# Patient Record
Sex: Female | Born: 1980 | Race: White | Hispanic: No | Marital: Married | Smoking: Never smoker
Health system: Southern US, Community
[De-identification: ages and names within clinical notes are randomized; demographics above are authoritative.]

## PROBLEM LIST (undated history)

## (undated) DIAGNOSIS — Z789 Other specified health status: Secondary | ICD-10-CM

## (undated) HISTORY — DX: Other specified health status: Z78.9

---

## 1999-02-22 ENCOUNTER — Other Ambulatory Visit: Admission: RE | Admit: 1999-02-22 | Discharge: 1999-02-22 | Payer: Self-pay | Admitting: Obstetrics and Gynecology

## 2000-02-23 ENCOUNTER — Other Ambulatory Visit: Admission: RE | Admit: 2000-02-23 | Discharge: 2000-02-23 | Payer: Self-pay | Admitting: Obstetrics and Gynecology

## 2009-01-02 ENCOUNTER — Ambulatory Visit: Payer: Self-pay | Admitting: Obstetrics and Gynecology

## 2009-01-02 ENCOUNTER — Inpatient Hospital Stay (HOSPITAL_COMMUNITY): Admission: AD | Admit: 2009-01-02 | Discharge: 2009-01-02 | Payer: Self-pay | Admitting: Obstetrics and Gynecology

## 2009-05-13 ENCOUNTER — Inpatient Hospital Stay (HOSPITAL_COMMUNITY): Admission: RE | Admit: 2009-05-13 | Discharge: 2009-05-15 | Payer: Self-pay | Admitting: Obstetrics & Gynecology

## 2009-05-13 ENCOUNTER — Encounter (INDEPENDENT_AMBULATORY_CARE_PROVIDER_SITE_OTHER): Payer: Self-pay | Admitting: Obstetrics & Gynecology

## 2010-03-19 LAB — URINALYSIS, ROUTINE W REFLEX MICROSCOPIC
Glucose, UA: NEGATIVE mg/dL
Ketones, ur: NEGATIVE mg/dL
Nitrite: NEGATIVE
Protein, ur: NEGATIVE mg/dL
Urobilinogen, UA: 0.2 mg/dL (ref 0.0–1.0)

## 2010-03-19 LAB — WET PREP, GENITAL: Clue Cells Wet Prep HPF POC: NONE SEEN

## 2010-03-19 LAB — URINE CULTURE
Colony Count: NO GROWTH
Culture: NO GROWTH

## 2010-03-19 LAB — CBC
HCT: 30.6 % — ABNORMAL LOW (ref 36.0–46.0)
Platelets: 156 10*3/uL (ref 150–400)
RDW: 12.8 % (ref 11.5–15.5)

## 2010-03-21 LAB — CBC
HCT: 37.2 % (ref 36.0–46.0)
Hemoglobin: 13 g/dL (ref 12.0–15.0)
MCV: 95.3 fL (ref 78.0–100.0)
RBC: 4.18 MIL/uL (ref 3.87–5.11)
WBC: 11.2 10*3/uL — ABNORMAL HIGH (ref 4.0–10.5)
WBC: 19.9 10*3/uL — ABNORMAL HIGH (ref 4.0–10.5)

## 2010-03-21 LAB — RPR: RPR Ser Ql: NONREACTIVE

## 2012-01-02 NOTE — L&D Delivery Note (Signed)
Delivery Note At 1:04 PM a viable and healthy female was delivered via  (Presentation: LOA ).  APGAR: 9, 9; weight pending.  Placenta status: spontaneous, intact .  Cord: 3 vessels with the following complications: none.  Cord pH: na NICU in attendance due to suspected fetal anomaly. MFM notified of delivery.  Anesthesia: epidural Episiotomy: none Lacerations: none Suture Repair: na Est. Blood Loss (mL): 300  Mom to postpartum.  Baby to Couplet care / Skin to Skin.  Jeffren Dombek J 11/13/2012, 1:17 PM

## 2012-04-08 LAB — OB RESULTS CONSOLE ABO/RH: RH Type: POSITIVE

## 2012-04-08 LAB — OB RESULTS CONSOLE RUBELLA ANTIBODY, IGM: Rubella: IMMUNE

## 2012-04-08 LAB — OB RESULTS CONSOLE HEPATITIS B SURFACE ANTIGEN: Hepatitis B Surface Ag: NEGATIVE

## 2012-04-08 LAB — OB RESULTS CONSOLE ANTIBODY SCREEN: Antibody Screen: NEGATIVE

## 2012-04-22 LAB — OB RESULTS CONSOLE GC/CHLAMYDIA
Chlamydia: NEGATIVE
Gonorrhea: NEGATIVE

## 2012-06-24 ENCOUNTER — Other Ambulatory Visit (HOSPITAL_COMMUNITY): Payer: Self-pay | Admitting: Obstetrics & Gynecology

## 2012-06-24 DIAGNOSIS — Z0373 Encounter for suspected fetal anomaly ruled out: Secondary | ICD-10-CM

## 2012-06-25 ENCOUNTER — Ambulatory Visit (HOSPITAL_COMMUNITY): Payer: Self-pay

## 2012-06-25 ENCOUNTER — Ambulatory Visit (HOSPITAL_COMMUNITY)
Admission: RE | Admit: 2012-06-25 | Discharge: 2012-06-25 | Disposition: A | Discharge status: Home / Self Care | Payer: Self-pay | Source: Ambulatory Visit | Attending: Obstetrics & Gynecology | Admitting: Obstetrics & Gynecology

## 2012-06-25 ENCOUNTER — Encounter (HOSPITAL_COMMUNITY): Payer: Self-pay

## 2012-06-25 VITALS — BP 113/68 | HR 85 | Wt 135.0 lb

## 2012-06-25 DIAGNOSIS — Z363 Encounter for antenatal screening for malformations: Secondary | ICD-10-CM | POA: Insufficient documentation

## 2012-06-25 DIAGNOSIS — O358XX0 Maternal care for other (suspected) fetal abnormality and damage, not applicable or unspecified: Secondary | ICD-10-CM | POA: Insufficient documentation

## 2012-06-25 DIAGNOSIS — Z0373 Encounter for suspected fetal anomaly ruled out: Secondary | ICD-10-CM

## 2012-06-25 DIAGNOSIS — Z1389 Encounter for screening for other disorder: Secondary | ICD-10-CM | POA: Insufficient documentation

## 2012-06-25 DIAGNOSIS — O359XX0 Maternal care for (suspected) fetal abnormality and damage, unspecified, not applicable or unspecified: Secondary | ICD-10-CM

## 2012-06-26 ENCOUNTER — Other Ambulatory Visit: Payer: Self-pay

## 2012-07-07 ENCOUNTER — Ambulatory Visit (HOSPITAL_COMMUNITY)
Admission: RE | Admit: 2012-07-07 | Discharge: 2012-07-07 | Disposition: A | Discharge status: Home / Self Care | Payer: Self-pay | Source: Ambulatory Visit | Attending: Obstetrics & Gynecology | Admitting: Obstetrics & Gynecology

## 2012-07-07 ENCOUNTER — Other Ambulatory Visit (HOSPITAL_COMMUNITY): Payer: Self-pay | Admitting: Maternal and Fetal Medicine

## 2012-07-07 DIAGNOSIS — Z3689 Encounter for other specified antenatal screening: Secondary | ICD-10-CM | POA: Insufficient documentation

## 2012-07-07 DIAGNOSIS — O359XX0 Maternal care for (suspected) fetal abnormality and damage, unspecified, not applicable or unspecified: Secondary | ICD-10-CM

## 2012-07-07 DIAGNOSIS — O358XX0 Maternal care for other (suspected) fetal abnormality and damage, not applicable or unspecified: Secondary | ICD-10-CM | POA: Insufficient documentation

## 2012-07-24 ENCOUNTER — Other Ambulatory Visit (HOSPITAL_COMMUNITY): Payer: Self-pay | Admitting: Obstetrics & Gynecology

## 2012-07-24 DIAGNOSIS — O358XX0 Maternal care for other (suspected) fetal abnormality and damage, not applicable or unspecified: Secondary | ICD-10-CM

## 2012-07-29 ENCOUNTER — Ambulatory Visit (HOSPITAL_COMMUNITY)
Admission: RE | Admit: 2012-07-29 | Discharge: 2012-07-29 | Disposition: A | Discharge status: Home / Self Care | Payer: Self-pay | Source: Ambulatory Visit | Attending: Obstetrics & Gynecology | Admitting: Obstetrics & Gynecology

## 2012-07-29 DIAGNOSIS — O358XX0 Maternal care for other (suspected) fetal abnormality and damage, not applicable or unspecified: Secondary | ICD-10-CM | POA: Insufficient documentation

## 2012-07-29 DIAGNOSIS — Z3689 Encounter for other specified antenatal screening: Secondary | ICD-10-CM | POA: Insufficient documentation

## 2012-08-12 ENCOUNTER — Telehealth (HOSPITAL_COMMUNITY): Payer: Self-pay | Admitting: MS"

## 2012-08-12 NOTE — Telephone Encounter (Signed)
Called Ms. YAZLEEMAR STRASSNER regarding prenatal consultation with [facility] pediatric surgery. Appointment is available on 8/20 at 11:00 am. Patient stated that she works two Wednesdays per month, but will plan on this appointment for now. She inquired about the self-pay price for the consult given that she is self-pay. I stated that I did not know the charge for the appointment but would work to find out. Patient has follow-up ultrasound on 8/19 in [place].   Clydie Braun Elide Stalzer 08/12/2012 3:53 PM

## 2012-08-15 ENCOUNTER — Other Ambulatory Visit (HOSPITAL_COMMUNITY): Payer: Self-pay | Admitting: Obstetrics & Gynecology

## 2012-08-15 DIAGNOSIS — O358XX1 Maternal care for other (suspected) fetal abnormality and damage, fetus 1: Secondary | ICD-10-CM

## 2012-08-19 ENCOUNTER — Ambulatory Visit (HOSPITAL_COMMUNITY)
Admission: RE | Admit: 2012-08-19 | Discharge: 2012-08-19 | Disposition: A | Discharge status: Home / Self Care | Payer: Self-pay | Source: Ambulatory Visit | Attending: Obstetrics & Gynecology | Admitting: Obstetrics & Gynecology

## 2012-08-19 VITALS — BP 117/68 | HR 86 | Wt 148.0 lb

## 2012-08-19 DIAGNOSIS — O358XX1 Maternal care for other (suspected) fetal abnormality and damage, fetus 1: Secondary | ICD-10-CM

## 2012-08-19 DIAGNOSIS — O358XX0 Maternal care for other (suspected) fetal abnormality and damage, not applicable or unspecified: Secondary | ICD-10-CM | POA: Insufficient documentation

## 2012-08-19 NOTE — Progress Notes (Signed)
Emma Butler  was seen today for an ultrasound appointment.  See full report in [facility].  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  No evidence of hydrops Normal amniotic fluid volume  Appropriate interval growth with EFW at the 57th %tile    Recommendations: Recommend follow-up ultrasound examination in 3 weeks for interval growth and reevaluation  Alpha Gula, MD

## 2012-09-09 ENCOUNTER — Encounter (HOSPITAL_COMMUNITY): Payer: Self-pay

## 2012-09-09 ENCOUNTER — Ambulatory Visit (HOSPITAL_COMMUNITY)
Admission: RE | Admit: 2012-09-09 | Discharge: 2012-09-09 | Disposition: A | Discharge status: Home / Self Care | Payer: Self-pay | Source: Ambulatory Visit | Attending: Obstetrics & Gynecology | Admitting: Obstetrics & Gynecology

## 2012-09-09 DIAGNOSIS — O358XX0 Maternal care for other (suspected) fetal abnormality and damage, not applicable or unspecified: Secondary | ICD-10-CM | POA: Insufficient documentation

## 2012-10-07 ENCOUNTER — Other Ambulatory Visit (HOSPITAL_COMMUNITY): Payer: Self-pay | Admitting: Obstetrics & Gynecology

## 2012-10-07 DIAGNOSIS — O358XX0 Maternal care for other (suspected) fetal abnormality and damage, not applicable or unspecified: Secondary | ICD-10-CM

## 2012-10-08 ENCOUNTER — Ambulatory Visit (HOSPITAL_COMMUNITY)
Admission: RE | Admit: 2012-10-08 | Discharge: 2012-10-08 | Disposition: A | Discharge status: Home / Self Care | Payer: Self-pay | Source: Ambulatory Visit | Attending: Obstetrics & Gynecology | Admitting: Obstetrics & Gynecology

## 2012-10-08 DIAGNOSIS — O358XX0 Maternal care for other (suspected) fetal abnormality and damage, not applicable or unspecified: Secondary | ICD-10-CM | POA: Insufficient documentation

## 2012-10-08 NOTE — Progress Notes (Signed)
Emma Butler  was seen today for an ultrasound appointment.  See full report in [facility].  Impression: IUP at 34 1/7  weeks CPAM identifed on previous studies difficult to delineate.   The left lower lung appears to be somewhat echogenic, but difficult to identify a distinct mass.  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 No mediastinal shift noted. Interval fetal growth is appropriate (54th %tile) Normal amniotic fluid volume   Recommendations: Feel that the patient may safely deliver at [facility]. Recommend follow up with Peds surgery regardless of the findings after delivery. Follow-up ultrasounds as clinically indicated.   Alpha Gula, MD

## 2012-10-29 ENCOUNTER — Encounter (HOSPITAL_COMMUNITY): Payer: Self-pay | Admitting: *Deleted

## 2012-10-29 ENCOUNTER — Telehealth (HOSPITAL_COMMUNITY): Payer: Self-pay | Admitting: *Deleted

## 2012-10-29 NOTE — Telephone Encounter (Signed)
Preadmission screen  

## 2012-10-31 ENCOUNTER — Other Ambulatory Visit: Payer: Self-pay | Admitting: Obstetrics & Gynecology

## 2012-11-05 LAB — OB RESULTS CONSOLE GBS: GBS: POSITIVE

## 2012-11-12 ENCOUNTER — Inpatient Hospital Stay (HOSPITAL_COMMUNITY)
Admission: RE | Admit: 2012-11-12 | Discharge: 2012-11-15 | DRG: 775 | Disposition: A | Discharge status: Home / Self Care | Payer: Self-pay | Source: Ambulatory Visit | Attending: Obstetrics and Gynecology | Admitting: Obstetrics and Gynecology

## 2012-11-12 ENCOUNTER — Other Ambulatory Visit: Payer: Self-pay | Admitting: Obstetrics and Gynecology

## 2012-11-12 ENCOUNTER — Encounter (HOSPITAL_COMMUNITY): Payer: Self-pay

## 2012-11-12 DIAGNOSIS — Z885 Allergy status to narcotic agent status: Secondary | ICD-10-CM

## 2012-11-12 DIAGNOSIS — O358XX0 Maternal care for other (suspected) fetal abnormality and damage, not applicable or unspecified: Principal | ICD-10-CM | POA: Diagnosis present

## 2012-11-12 LAB — CBC
HCT: 36.6 % (ref 36.0–46.0)
Hemoglobin: 12.9 g/dL (ref 12.0–15.0)
MCHC: 35.2 g/dL (ref 30.0–36.0)
WBC: 12.6 10*3/uL — ABNORMAL HIGH (ref 4.0–10.5)

## 2012-11-12 MED ORDER — LIDOCAINE HCL (PF) 1 % IJ SOLN
30.0000 mL | INTRAMUSCULAR | Status: DC | PRN
  Filled 2012-11-12: qty 30

## 2012-11-12 MED ORDER — ONDANSETRON HCL 4 MG/2ML IJ SOLN: 4.0000 mg | Freq: Four times a day (QID) | INTRAMUSCULAR | Status: DC | PRN

## 2012-11-12 MED ORDER — DIPHENHYDRAMINE HCL 50 MG/ML IJ SOLN: 12.5000 mg | INTRAMUSCULAR | Status: DC | PRN

## 2012-11-12 MED ORDER — FENTANYL 2.5 MCG/ML BUPIVACAINE 1/10 % EPIDURAL INFUSION (WH - ANES)
14.0000 mL/h | INTRAMUSCULAR | Status: DC | PRN
  Filled 2012-11-12: qty 125

## 2012-11-12 MED ORDER — MISOPROSTOL 25 MCG QUARTER TABLET
25.0000 ug | ORAL_TABLET | ORAL | Status: DC | PRN
  Administered 2012-11-12 – 2012-11-13 (×3): 25 ug via VAGINAL
  Filled 2012-11-12: qty 0.25
  Filled 2012-11-12: qty 1
  Filled 2012-11-12 (×2): qty 0.25

## 2012-11-12 MED ORDER — PENICILLIN G POTASSIUM 5000000 UNITS IJ SOLR: 5.0000 10*6.[IU] | Freq: Once | INTRAVENOUS | Status: DC

## 2012-11-12 MED ORDER — TERBUTALINE SULFATE 1 MG/ML IJ SOLN: 0.2500 mg | Freq: Once | INTRAMUSCULAR | Status: AC | PRN

## 2012-11-12 MED ORDER — LACTATED RINGERS IV SOLN: 500.0000 mL | INTRAVENOUS | Status: DC | PRN

## 2012-11-12 MED ORDER — OXYTOCIN 40 UNITS IN LACTATED RINGERS INFUSION - SIMPLE MED: 62.5000 mL/h | INTRAVENOUS | Status: DC

## 2012-11-12 MED ORDER — LACTATED RINGERS IV SOLN
INTRAVENOUS | Status: DC
  Administered 2012-11-13 (×3): via INTRAVENOUS

## 2012-11-12 MED ORDER — PENICILLIN G POTASSIUM 5000000 UNITS IJ SOLR: 2.5000 10*6.[IU] | INTRAMUSCULAR | Status: DC

## 2012-11-12 MED ORDER — PHENYLEPHRINE 40 MCG/ML (10ML) SYRINGE FOR IV PUSH (FOR BLOOD PRESSURE SUPPORT)
80.0000 ug | PREFILLED_SYRINGE | INTRAVENOUS | Status: DC | PRN
  Filled 2012-11-12: qty 2

## 2012-11-12 MED ORDER — PHENYLEPHRINE 40 MCG/ML (10ML) SYRINGE FOR IV PUSH (FOR BLOOD PRESSURE SUPPORT)
80.0000 ug | PREFILLED_SYRINGE | INTRAVENOUS | Status: DC | PRN
Start: 2012-11-12 — End: 2012-11-13
  Filled 2012-11-12: qty 2
  Filled 2012-11-12: qty 10

## 2012-11-12 MED ORDER — ZOLPIDEM TARTRATE 5 MG PO TABS
5.0000 mg | ORAL_TABLET | Freq: Every evening | ORAL | Status: DC | PRN
  Administered 2012-11-12: 5 mg via ORAL
  Filled 2012-11-12: qty 1

## 2012-11-12 MED ORDER — ACETAMINOPHEN 325 MG PO TABS: 650.0000 mg | ORAL_TABLET | ORAL | Status: DC | PRN

## 2012-11-12 MED ORDER — OXYTOCIN 40 UNITS IN LACTATED RINGERS INFUSION - SIMPLE MED
1.0000 m[IU]/min | INTRAVENOUS | Status: DC
  Administered 2012-11-13 (×2): 2 m[IU]/min via INTRAVENOUS
  Filled 2012-11-12: qty 1000

## 2012-11-12 MED ORDER — OXYTOCIN BOLUS FROM INFUSION: 500.0000 mL | INTRAVENOUS | Status: DC

## 2012-11-12 MED ORDER — EPHEDRINE 5 MG/ML INJ
10.0000 mg | INTRAVENOUS | Status: DC | PRN
  Filled 2012-11-12: qty 4
  Filled 2012-11-12: qty 2

## 2012-11-12 MED ORDER — LACTATED RINGERS IV SOLN: 500.0000 mL | Freq: Once | INTRAVENOUS | Status: DC

## 2012-11-12 MED ORDER — CITRIC ACID-SODIUM CITRATE 334-500 MG/5ML PO SOLN: 30.0000 mL | ORAL | Status: DC | PRN

## 2012-11-12 MED ORDER — OXYCODONE-ACETAMINOPHEN 5-325 MG PO TABS: 1.0000 | ORAL_TABLET | ORAL | Status: DC | PRN

## 2012-11-12 MED ORDER — IBUPROFEN 600 MG PO TABS: 600.0000 mg | ORAL_TABLET | Freq: Four times a day (QID) | ORAL | Status: DC | PRN

## 2012-11-12 MED ORDER — EPHEDRINE 5 MG/ML INJ
10.0000 mg | INTRAVENOUS | Status: DC | PRN
  Filled 2012-11-12: qty 2

## 2012-11-12 NOTE — Progress Notes (Signed)
Emma Butler is a 32 y.o. G2P1001 at [redacted]w[redacted]d by LMP admitted for induction of labor due to suspected fetal anomaly.  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  Catherin Doorn J 11/12/2012, 8:46 PM

## 2012-11-13 ENCOUNTER — Encounter (HOSPITAL_COMMUNITY): Payer: Self-pay | Admitting: Anesthesiology

## 2012-11-13 ENCOUNTER — Inpatient Hospital Stay (HOSPITAL_COMMUNITY): Admission: RE | Admit: 2012-11-13 | Payer: Self-pay | Source: Ambulatory Visit

## 2012-11-13 ENCOUNTER — Encounter (HOSPITAL_COMMUNITY): Payer: Self-pay

## 2012-11-13 ENCOUNTER — Inpatient Hospital Stay (HOSPITAL_COMMUNITY): Payer: Self-pay | Admitting: Anesthesiology

## 2012-11-13 ENCOUNTER — Inpatient Hospital Stay (HOSPITAL_COMMUNITY): Admission: AD | Admit: 2012-11-13 | Payer: Self-pay | Source: Ambulatory Visit | Admitting: Obstetrics & Gynecology

## 2012-11-13 LAB — RPR: RPR Ser Ql: NONREACTIVE

## 2012-11-13 LAB — ABO/RH: ABO/RH(D): O POS

## 2012-11-13 MED ORDER — FENTANYL 2.5 MCG/ML BUPIVACAINE 1/10 % EPIDURAL INFUSION (WH - ANES)
INTRAMUSCULAR | Status: DC | PRN
  Administered 2012-11-13: 14 mL/h via EPIDURAL

## 2012-11-13 MED ORDER — METHYLERGONOVINE MALEATE 0.2 MG/ML IJ SOLN: 0.2000 mg | INTRAMUSCULAR | Status: DC | PRN

## 2012-11-13 MED ORDER — EPHEDRINE 5 MG/ML INJ: 10.0000 mg | INTRAVENOUS | Status: DC | PRN

## 2012-11-13 MED ORDER — SENNOSIDES-DOCUSATE SODIUM 8.6-50 MG PO TABS
2.0000 | ORAL_TABLET | ORAL | Status: DC
  Administered 2012-11-14: 2 via ORAL
  Filled 2012-11-13 (×2): qty 2

## 2012-11-13 MED ORDER — SIMETHICONE 80 MG PO CHEW: 80.0000 mg | CHEWABLE_TABLET | ORAL | Status: DC | PRN

## 2012-11-13 MED ORDER — PRENATAL MULTIVITAMIN CH
1.0000 | ORAL_TABLET | Freq: Every day | ORAL | Status: DC
  Administered 2012-11-14 – 2012-11-15 (×2): 1 via ORAL
  Filled 2012-11-13 (×2): qty 1

## 2012-11-13 MED ORDER — ONDANSETRON HCL 4 MG/2ML IJ SOLN: 4.0000 mg | INTRAMUSCULAR | Status: DC | PRN

## 2012-11-13 MED ORDER — WITCH HAZEL-GLYCERIN EX PADS: 1.0000 "application " | MEDICATED_PAD | CUTANEOUS | Status: DC | PRN

## 2012-11-13 MED ORDER — METHYLERGONOVINE MALEATE 0.2 MG PO TABS: 0.2000 mg | ORAL_TABLET | ORAL | Status: DC | PRN

## 2012-11-13 MED ORDER — ONDANSETRON HCL 4 MG PO TABS: 4.0000 mg | ORAL_TABLET | ORAL | Status: DC | PRN

## 2012-11-13 MED ORDER — DIPHENHYDRAMINE HCL 25 MG PO CAPS: 25.0000 mg | ORAL_CAPSULE | Freq: Four times a day (QID) | ORAL | Status: DC | PRN

## 2012-11-13 MED ORDER — PHENYLEPHRINE 40 MCG/ML (10ML) SYRINGE FOR IV PUSH (FOR BLOOD PRESSURE SUPPORT): 80.0000 ug | PREFILLED_SYRINGE | INTRAVENOUS | Status: DC | PRN

## 2012-11-13 MED ORDER — TETANUS-DIPHTH-ACELL PERTUSSIS 5-2.5-18.5 LF-MCG/0.5 IM SUSP: 0.5000 mL | Freq: Once | INTRAMUSCULAR | Status: DC

## 2012-11-13 MED ORDER — FENTANYL 2.5 MCG/ML BUPIVACAINE 1/10 % EPIDURAL INFUSION (WH - ANES): 14.0000 mL/h | INTRAMUSCULAR | Status: DC | PRN

## 2012-11-13 MED ORDER — IBUPROFEN 600 MG PO TABS
600.0000 mg | ORAL_TABLET | Freq: Four times a day (QID) | ORAL | Status: DC
  Administered 2012-11-13 – 2012-11-15 (×7): 600 mg via ORAL
  Filled 2012-11-13 (×7): qty 1

## 2012-11-13 MED ORDER — OXYCODONE-ACETAMINOPHEN 5-325 MG PO TABS: 1.0000 | ORAL_TABLET | ORAL | Status: DC | PRN

## 2012-11-13 MED ORDER — DEXTROSE 5 % IV SOLN
5.0000 10*6.[IU] | Freq: Once | INTRAVENOUS | Status: AC
  Administered 2012-11-13: 5 10*6.[IU] via INTRAVENOUS
  Filled 2012-11-13: qty 5

## 2012-11-13 MED ORDER — PENICILLIN G POTASSIUM 5000000 UNITS IJ SOLR
2.5000 10*6.[IU] | INTRAVENOUS | Status: DC
  Administered 2012-11-13 (×2): 2.5 10*6.[IU] via INTRAVENOUS
  Filled 2012-11-13 (×6): qty 2.5

## 2012-11-13 MED ORDER — BENZOCAINE-MENTHOL 20-0.5 % EX AERO: 1.0000 "application " | INHALATION_SPRAY | CUTANEOUS | Status: DC | PRN

## 2012-11-13 MED ORDER — DIPHENHYDRAMINE HCL 50 MG/ML IJ SOLN: 12.5000 mg | INTRAMUSCULAR | Status: DC | PRN

## 2012-11-13 MED ORDER — LACTATED RINGERS IV SOLN
500.0000 mL | Freq: Once | INTRAVENOUS | Status: AC
  Administered 2012-11-13: 500 mL via INTRAVENOUS

## 2012-11-13 MED ORDER — LIDOCAINE HCL (PF) 1 % IJ SOLN
INTRAMUSCULAR | Status: DC | PRN
  Administered 2012-11-13 (×2): 4 mL

## 2012-11-13 MED ORDER — LANOLIN HYDROUS EX OINT: TOPICAL_OINTMENT | CUTANEOUS | Status: DC | PRN

## 2012-11-13 MED ORDER — ZOLPIDEM TARTRATE 5 MG PO TABS: 5.0000 mg | ORAL_TABLET | Freq: Every evening | ORAL | Status: DC | PRN

## 2012-11-13 MED ORDER — DIBUCAINE 1 % RE OINT: 1.0000 "application " | TOPICAL_OINTMENT | RECTAL | Status: DC | PRN

## 2012-11-13 NOTE — Lactation Note (Signed)
This note was copied from the chart of Emma Ronita Hargreaves. Lactation Consultation Note  Initial visit at 8 hours of age.  [facility] LC resources given and discussed. Baby already latched upon my arrival but slips off.  Mom is holding in cradle hold and assisted to cross cradle, mom resistant to position change.  Baby rolls up bottom lip, chin tug taught to mom .  Baby is able to get deep latch with few swallows, but licks and does not always latch back well when she comes off.  Assisted with football on right breast with few sucks.  Baby has nursed about 15 of the 30 minutes during attempt.  Baby has wet diaper.  Mom is expecting baby's bath soon. Discussed deep latch, positioning, pillow support, hand expression and cue feedings.  Mom to call for assist as needed.  Patient Name: Emma Butler [id number] Date: 11/13/2012 Reason for consult: Initial assessment   Maternal Data Formula Feeding for Exclusion: No Infant to breast within first hour of birth: Yes Has patient been taught Hand Expression?: Yes Does the patient have breastfeeding experience prior to this delivery?: Yes  Feeding Feeding Type: Breast Fed Length of feed: 15 min  LATCH Score/Interventions Latch: Repeated attempts needed to sustain latch, nipple held in mouth throughout feeding, stimulation needed to elicit sucking reflex. Intervention(s): Breast compression;Breast massage;Assist with latch;Adjust position  Audible Swallowing: A few with stimulation  Type of Nipple: Everted at rest and after stimulation  Comfort (Breast/Nipple): Soft / non-tender     Hold (Positioning): Assistance needed to correctly position infant at breast and maintain latch. Intervention(s): Skin to skin;Position options;Support Pillows;Breastfeeding basics reviewed  LATCH Score: 7  Lactation Tools Discussed/Used     Consult Status Consult Status: PRN    Jannifer Rodney 11/13/2012, 9:21 PM

## 2012-11-13 NOTE — Anesthesia Preprocedure Evaluation (Signed)

## 2012-11-13 NOTE — Anesthesia Procedure Notes (Signed)
Epidural Patient location during procedure: OB Start time: 11/13/2012 9:54 AM  Staffing Anesthesiologist: Mical Kicklighter A. Performed by: anesthesiologist   Preanesthetic Checklist Completed: patient identified, site marked, surgical consent, pre-op evaluation, timeout performed, IV checked, risks and benefits discussed and monitors and equipment checked  Epidural Patient position: sitting Prep: site prepped and draped and DuraPrep Patient monitoring: continuous pulse ox and blood pressure Approach: midline Injection technique: LOR air  Needle:  Needle type: Tuohy  Needle gauge: 17 G Needle length: 9 cm and 9 Needle insertion depth: 5 cm cm Catheter type: closed end flexible Catheter size: 19 Gauge Catheter at skin depth: 10 cm Test dose: negative and Other  Assessment Events: blood not aspirated, injection not painful, no injection resistance, negative IV test and no paresthesia  Additional Notes Patient identified. Risks and benefits discussed including failed block, incomplete  Pain control, post dural puncture headache, nerve damage, paralysis, blood pressure Changes, nausea, vomiting, reactions to medications-both toxic and allergic and post Partum back pain. All questions were answered. Patient expressed understanding and wished to proceed. Sterile technique was used throughout procedure. Epidural site was Dressed with sterile barrier dressing. No paresthesias, signs of intravascular injection Or signs of intrathecal spread were encountered.  Patient was more comfortable after the epidural was dosed. Please see RN's note for documentation of vital signs and FHR which are stable.

## 2012-11-13 NOTE — Progress Notes (Signed)
Emma Butler is a 32 y.o. G2P1001 at [redacted]w[redacted]d by LMP admitted for induction of labor due to suspected fetal anomaly.  Subjective: comfortable  Objective: BP 101/69  Pulse 74  Temp(Src) 98.5 F (36.9 C) (Oral)  Resp 18  Ht 5\' 3"  (1.6 m)  Wt 71.215 kg (157 lb)  BMI 27.82 kg/m2  SpO2 98%  LMP 02/12/2012      FHT:  FHR: 125 bpm, variability: moderate,  accelerations:  Present,  decelerations:  Absent UC:   regular, every 2 minutes SVE:   Dilation: 6 Effacement (%): 90 Station: 0 Exam by:: Bonnie Roig  Labs: Lab Results  Component Value Date   WBC 12.6* 11/12/2012   HGB 12.9 11/12/2012   HCT 36.6 11/12/2012   MCV 88.2 11/12/2012   PLT 185 11/12/2012    Assessment / Plan: Induction of labor due to fetal anomaly,  progressing well on pitocin  Labor: Progressing normally Preeclampsia:  no signs or symptoms of toxicity Fetal Wellbeing:  Category I Pain Control:  Epidural I/D:  n/a Anticipated MOD:  NSVD  Kairon Shock J 11/13/2012, 11:50 AM

## 2012-11-13 NOTE — Progress Notes (Signed)
Emma Butler is a 32 y.o. G2P1001 at [redacted]w[redacted]d by LMP admitted for induction of labor due to suspected fetal anomaly.  Subjective: comfortable  Objective: BP 104/75  Pulse 81  Temp(Src) 98.5 F (36.9 C) (Oral)  Resp 20  Ht 5\' 3"  (1.6 m)  Wt 71.215 kg (157 lb)  BMI 27.82 kg/m2  LMP 02/12/2012      FHT:  FHR: 155 bpm, variability: moderate,  accelerations:  Present,  decelerations:  Absent UC:   irregular, every 4 minutes SVE:   Dilation: 3 Effacement (%): 70 Station: -2 Exam by:: dr. Billy Coast AROM- clear  Labs: Lab Results  Component Value Date   WBC 12.6* 11/12/2012   HGB 12.9 11/12/2012   HCT 36.6 11/12/2012   MCV 88.2 11/12/2012   PLT 185 11/12/2012    Assessment / Plan: Induction of labor due to suspected fetal anomaly,  progressing well on pitocin  Labor: Progressing normally Preeclampsia:  no signs or symptoms of toxicity Fetal Wellbeing:  Category I Pain Control:  Labor support without medications I/D:  n/a Anticipated MOD:  NSVD NICU aware  Emma Butler J 11/13/2012, 8:52 AM

## 2012-11-14 LAB — CBC
Hemoglobin: 12.6 g/dL (ref 12.0–15.0)
Platelets: 166 10*3/uL (ref 150–400)
RBC: 4.13 MIL/uL (ref 3.87–5.11)
WBC: 13.9 10*3/uL — ABNORMAL HIGH (ref 4.0–10.5)

## 2012-11-14 NOTE — Anesthesia Postprocedure Evaluation (Signed)
  Anesthesia Post-op Note  Patient: Emma Butler  Procedure(s) Performed: * No procedures listed *  Patient Location: PACU  Anesthesia Type:Epidural  Level of Consciousness: awake, alert  and oriented  Airway and Oxygen Therapy: Patient Spontanous Breathing  Post-op Pain: none   Post-op Assessment: Post-op Vital signs reviewed, Patient's Cardiovascular Status Stable, Respiratory Function Stable, Patent Airway, No signs of Nausea or vomiting, Pain level controlled, No headache, No backache, No residual numbness and No residual motor weakness  Post-op Vital Signs: Reviewed and stable  Complications: No apparent anesthesia complications

## 2012-11-14 NOTE — H&P (Signed)
Emma Butler, Emma Butler               ACCOUNT NO.:  [id number]  MEDICAL RECORD NO.:  [id number]  LOCATION:  9173                          FACILITY:  [facility]  PHYSICIAN:  Lenoard Aden, M.D.DATE OF BIRTH:  [birth year]  DATE OF ADMISSION:  11/12/2012 DATE OF DISCHARGE:                             HISTORY & PHYSICAL   CHIEF COMPLAINT:  Induction for suspected fetal anomaly.  HISTORY OF PRESENT ILLNESS:  She is a 32 year old white female, G2, P1, at 39 weeks and 1/7th days who presents for induction of labor due to suspected fetal anomaly.  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  Recommendations were that she be induced at 39 weeks, and she have a Pediatric Surgery consultation.  Chest CT of the baby post delivery.  ALLERGIES:  The patient has allergies to CODEINE, adhesive tape.  SOCIAL HISTORY:  She is a nonsmoker, nondrinker.  She denies domestic or physical violence.  She has a previous history of vaginal delivery.  MEDICATIONS:  Prenatal vitamins.  FAMILY HISTORY:  Lung cancer, depression, ovarian cancer, migraine headache, chronic hypertension, heart disease, history of a 7 pound 13 ounce child born in 2011.  No previous surgical history.  PHYSICAL EXAMINATION:  GENERAL:  She is a well-developed, well- nourished, white female, in no acute distress. HEENT:  Normal. NECK:  Supple.  Full range of motion. LUNGS:  Clear. HEART:  Regular rate and rhythm. ABDOMEN:  Soft, gravid, nontender.  Estimated fetal weight 6.5 to 7 pounds.  Cervix per RN, 2 cm, 50% vertex, -2.  Cytotec placed. EXTREMITIES:  There are no cords. NEUROLOGIC:  Nonfocal. SKIN:  Intact.  NST is reactive.  IMPRESSION: 1. Thirty-nine weeks. 2. Suspected fetal anomaly with questionable resolution of congenital     pulmonary adenoid malformation.  PLAN:  To proceed with Cytotec, Pitocin in a.m., epidural as  needed, NICU at delivery, NICU to be notified recommendations for neonatal chest CT and Pediatric Surgical consult post delivery.     Lenoard Aden, M.D.     RJT/MEDQ  D:  11/12/2012  T:  11/13/2012  Job:  [id number]

## 2012-11-14 NOTE — Progress Notes (Signed)
PPD 1 SVD  S:  Reports feeling well             Tolerating po/ No nausea or vomiting             Bleeding is light             Pain controlled with motrin and percocet             Up ad lib / ambulatory / voiding QS  Newborn breast feeding    O:               VS: BP 96/57  Pulse 56  Temp(Src) 98.4 F (36.9 C) (Oral)  Resp 18  Ht 5\' 3"  (1.6 m)  Wt 71.215 kg (157 lb)  BMI 27.82 kg/m2  SpO2 98%  LMP 02/12/2012   LABS:              Recent Labs  11/12/12 2015 11/14/12 0530  WBC 12.6* 13.9*  HGB 12.9 12.6  PLT 185 166               Blood type: --/--/O POS (11/12 2015)  Rubella: Immune (04/08 0000)                     I&O: Intake/Output     11/13 0701 - 11/14 0700 11/14 0701 - 11/15 0700   Blood 300    Total Output 300     Net -300                        Physical Exam:             Alert and oriented X3  Lungs: Clear and unlabored  Heart: regular rate and rhythm / no mumurs  Abdomen: soft, non-tender, non-distended              Fundus: firm, non-tender, U-1  Perineum: no edema  Lochia: light  Extremities: no edema, no calf pain or tenderness    A: PPD # 1   Doing well - stable status  P: Routine post partum orders  DC in am  Marlinda Mike CNM, MSN, [facility] 11/14/2012, 12:59 PM

## 2012-11-15 MED ORDER — IBUPROFEN 600 MG PO TABS: 600.0000 mg | ORAL_TABLET | Freq: Four times a day (QID) | ORAL | Status: DC

## 2012-11-15 MED ORDER — FLUCONAZOLE 150 MG PO TABS: 150.0000 mg | ORAL_TABLET | Freq: Every day | ORAL | Status: DC

## 2012-11-15 NOTE — Discharge Summary (Signed)
Obstetric Discharge Summary  Reason for Admission: induction of labor Prenatal Procedures: ultrasound Intrapartum Procedures: spontaneous vaginal delivery and GBS prophylaxis Postpartum Procedures: none Complications-Operative and Postpartum: none Hemoglobin  Date Value Range Status  11/14/2012 12.6  12.0 - 15.0 g/dL Final     HCT  Date Value Range Status  11/14/2012 36.7  36.0 - 46.0 % Final    Physical Exam:  General: alert, cooperative and no distress Lochia: appropriate Uterine Fundus: firm DVT Evaluation: No evidence of DVT seen on physical exam.  Discharge Diagnoses: Term Pregnancy-delivered  Discharge Information: Date: 11/15/2012 Activity: pelvic rest Diet: routine Medications: PNV and Ibuprofen Condition: stable Instructions: refer to practice specific booklet Discharge to: home Follow-up Information   Follow up with Emma Aden, MD. Schedule an appointment as soon as possible for a visit in 6 weeks.   Specialty:  Obstetrics and Gynecology   Contact information:   [address] [place] [address] [phone]       Newborn Data: Live born female  Birth Weight: 7 lb 2.5 oz (3246 g) APGAR: 9, 9  Home with mother.  Emma Butler 11/15/2012, 12:19 PM

## 2012-11-15 NOTE — Progress Notes (Signed)
PPD 2 SVD  S:  Reports feeling great, ready to go home             Tolerating po/ No nausea or vomiting             Bleeding is light             Pain controlled withMotrin             Up ad lib / ambulatory / voiding QS  Newborn breast feeding    O:               VS: BP 105/56  Pulse 53  Temp(Src) 98.1 F (36.7 C) (Oral)  Resp 18  Ht 5\' 3"  (1.6 m)  Wt 71.215 kg (157 lb)  BMI 27.82 kg/m2  SpO2 100%  LMP 02/12/2012   LABS:              Recent Labs  11/12/12 2015 11/14/12 0530  WBC 12.6* 13.9*  HGB 12.9 12.6  PLT 185 166               Blood type: --/--/O POS (11/12 2015)  Rubella: Immune (04/08 0000)                                  Physical Exam:             Alert and oriented X3  Lungs: Clear and unlabored  Heart: regular rate and rhythm / no mumurs  Abdomen: soft, non-tender, non-distended              Fundus: firm, non-tender, U-2  Perineum: intact  Lochia: scant to light rubra, no clots  Extremities: +1 pedal edema, no calf pain or tenderness    A: PPD # 2   Doing well - stable status  P: Routine post partum orders  D/C home today  Cyndee Brightly [facility] 11/15/2012, 12:07 PM

## 2013-01-10 NOTE — Progress Notes (Signed)
Seen and agree at time of discharge from hospital  Emma Butler Fix CNM MSN [id number]

## 2013-11-02 ENCOUNTER — Encounter (HOSPITAL_COMMUNITY): Payer: Self-pay

## 2014-03-12 IMAGING — US US OB FOLLOW-UP
1 series · 12 of 28 positions shown · non-contrast
Comparison: none

[Series 1: us ob follow-up · 0.23mm/px · 12 of 43 slices shown]
[im 2/43]
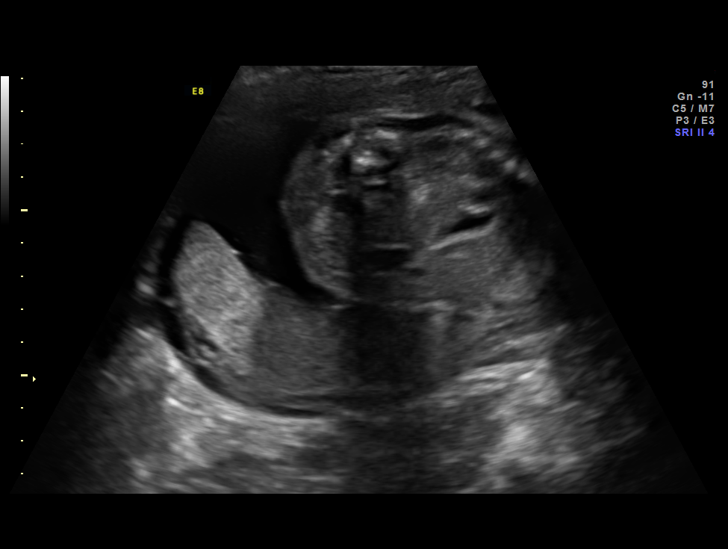
[im 5/43]
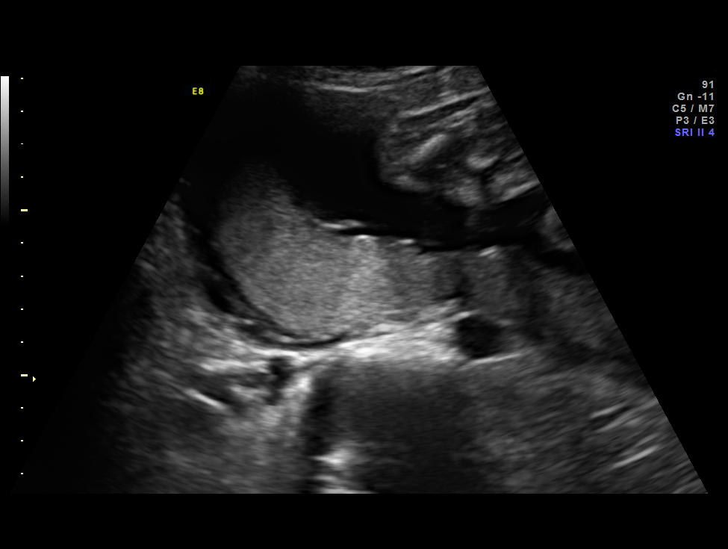
[im 8/43]
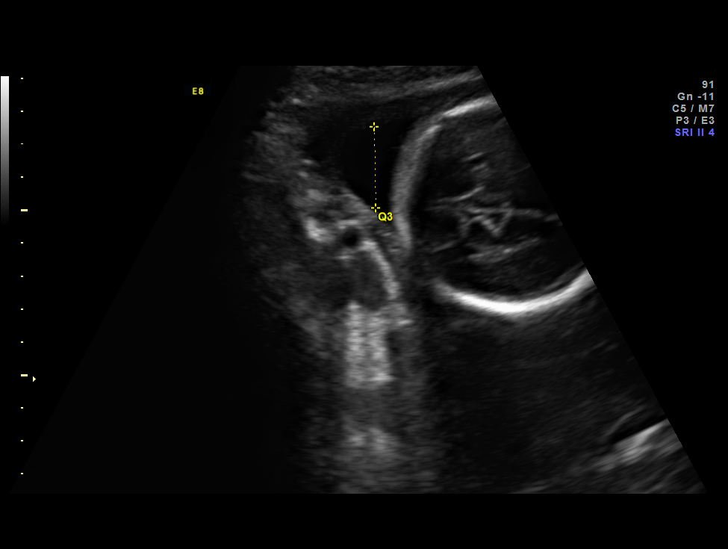
[im 13/43]
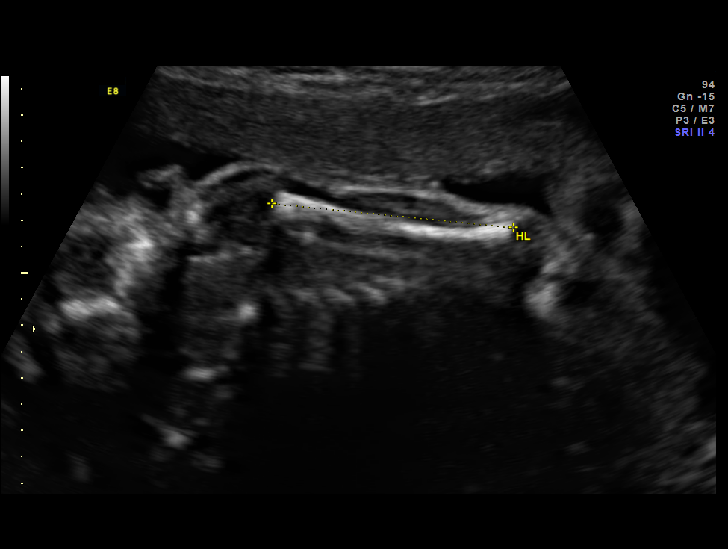
[im 16/43]
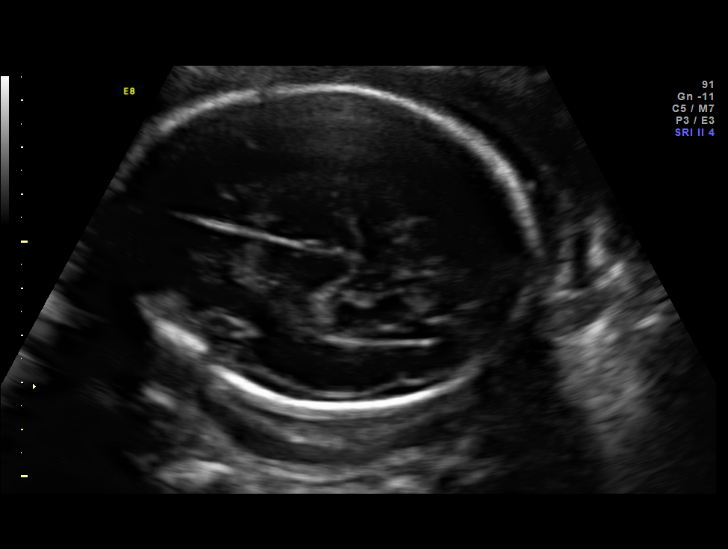
[im 19/43]
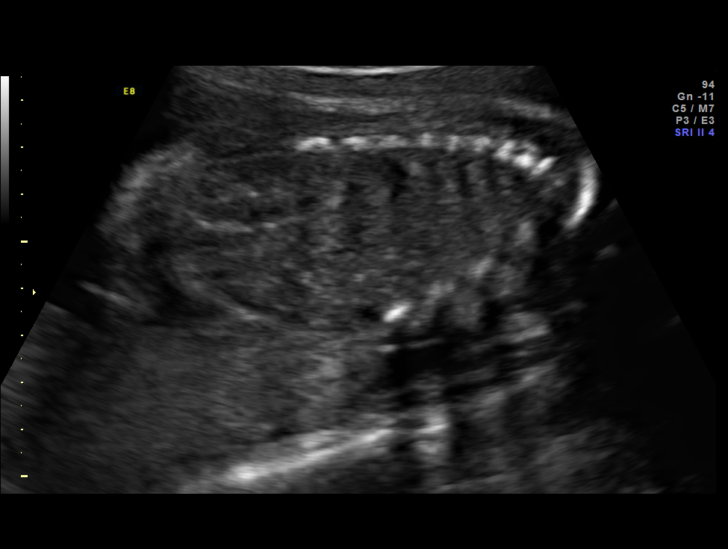
[im 24/43]
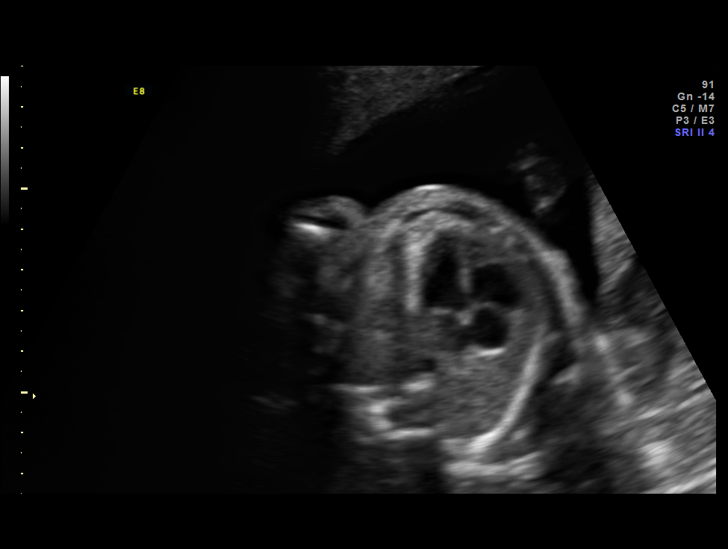
[im 27/43]
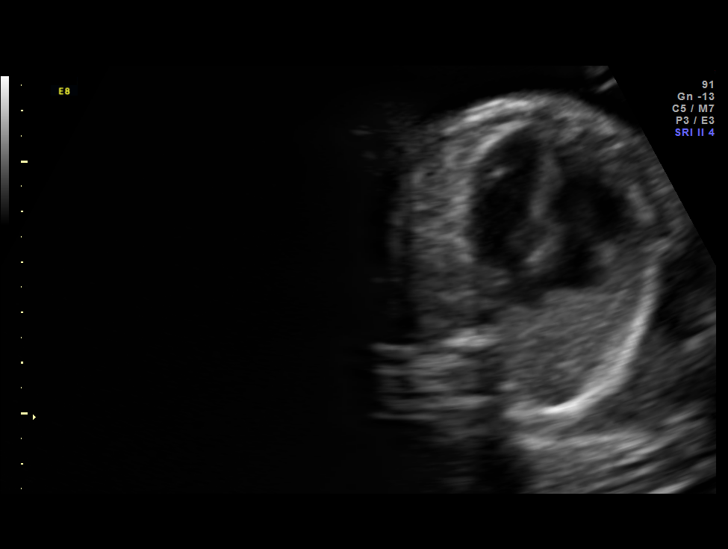
[im 30/43]
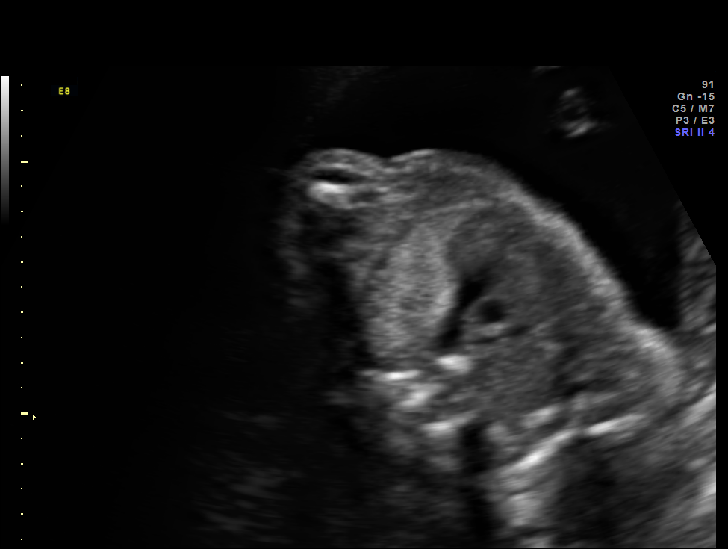
[im 35/43]
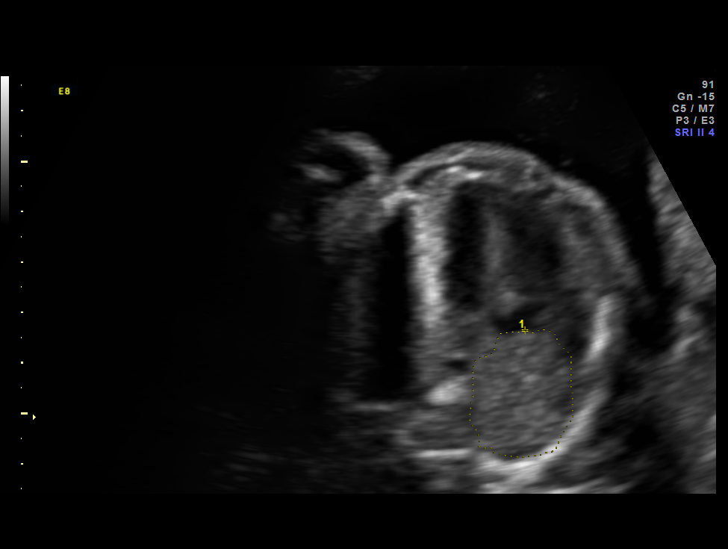
[im 38/43]
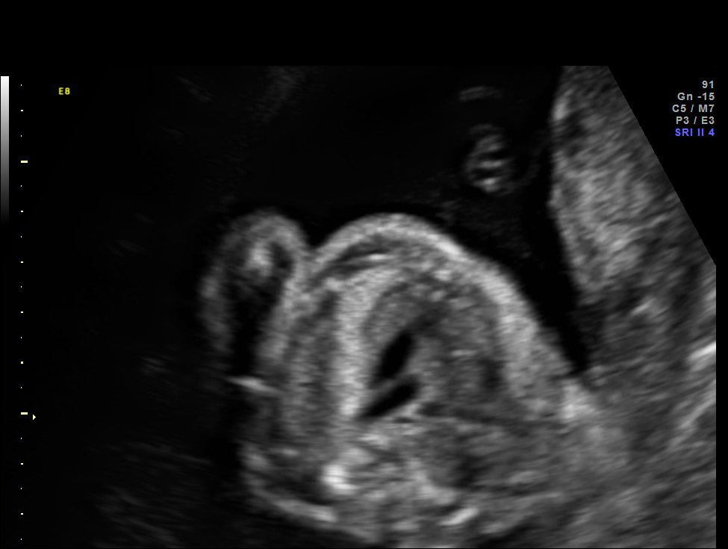
[im 41/43]
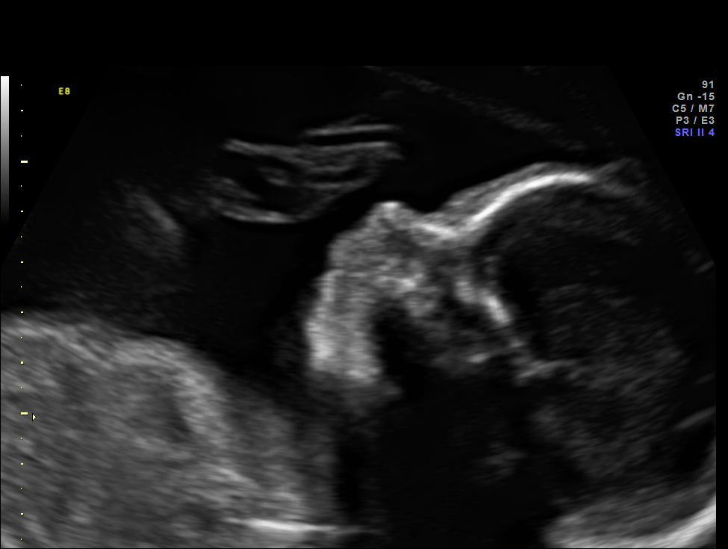

[12 of 28 positions shown; findings below may reference images not displayed]

OBSTETRICS REPORT
                      (Signed Final 08/19/2012 [DATE])

Service(s) Provided

 US OB FOLLOW UP                                       76816.1
Indications

 Fetal abnormality - other known or suspected
 (chest mass)
Fetal Evaluation

 Num Of Fetuses:    1
 Fetal Heart Rate:  135                          bpm
 Cardiac Activity:  Observed
 Presentation:      Cephalic
 Placenta:          Posterior, above cervical
                    os
 P. Cord            Previously Visualized
 Insertion:

 Amniotic Fluid
 AFI FV:      Subjectively within normal limits
 AFI Sum:     14.67   cm       50  %Tile     Larg Pckt:    5.09  cm
 RUQ:   3.84    cm   RLQ:    5.09   cm    LUQ:   3.28    cm   LLQ:    2.46   cm
Biometry

 BPD:     67.2  mm     G. Age:  27w 1d                CI:         73.5   70 - 86
 OFD:     91.4  mm                                    FL/HC:      19.6   18.6 -

 HC:     255.2  mm     G. Age:  27w 5d       46  %    HC/AC:      1.11   1.05 -

 AC:     228.9  mm     G. Age:  27w 2d       50  %    FL/BPD:     74.6   71 - 87
 FL:      50.1  mm     G. Age:  27w 0d       34  %    FL/AC:      21.9   20 - 24
 HUM:     46.1  mm     G. Age:  27w 1d       51  %

 Est. FW:    9342  gm      2 lb 5 oz     57  %
Gestational Age

 LMP:           27w 0d        Date:  02/12/12                 EDD:   11/18/12
 U/S Today:     27w 2d                                        EDD:   11/16/12
 Best:          27w 0d     Det. By:  LMP  (02/12/12)          EDD:   11/18/12
Anatomy
 Cranium:          Previously seen        Aortic Arch:      Previously seen
 Fetal Cavum:      Previously seen        Ductal Arch:      Previously seen
 Ventricles:       Previously seen        Diaphragm:        Appears normal
 Choroid Plexus:   Previously seen        Stomach:          Appears normal, left
                                                            sided
 Cerebellum:       Previously seen        Abdomen:          Previously seen
 Posterior Fossa:  Previously seen        Abdominal Wall:   Previously seen
 Nuchal Fold:      Previously seen        Cord Vessels:     Previously seen
 Face:             Orbits and profile     Kidneys:          Appear normal
                   previously seen
 Lips:             Previously seen        Bladder:          Appears normal
 Heart:            Appears normal         Spine:            Previously seen
                   (4CH, axis, and
                   situs)
 RVOT:             Previously seen        Lower             Previously seen
                                          Extremities:
 LVOT:             Previously seen        Upper             Previously seen
                                          Extremities:

 Other:  Heels and 5th digit previously seen.
Cervix Uterus Adnexa

 Cervix:       Not visualized
Impression

 Single IUP at 27 0/7 weeks
 CPAM in left chest measuring 3.0 x 1.8 x 2.5 cms; minimal
 mediastinal shift; CVR - 0.27 (low risk for hydrops,
 unchanged from prior studies)
 Interval anatomy within normal limits.  No evidence of hydrops
 Normal amniotic fluid volume
 Appropriate interval growth with EFW at the 57th %tile
Recommendations

 Recommend follow-up ultrasound examination in 3 weeks for
 interval growth and reevaluation

 questions or concerns.

## 2014-05-01 IMAGING — US US OB FOLLOW-UP
1 series · 12 of 28 positions shown · non-contrast
Comparison: none

[Series 1: us ob follow-up · 0.23mm/px · 12 of 47 slices shown]
[im 2/47]
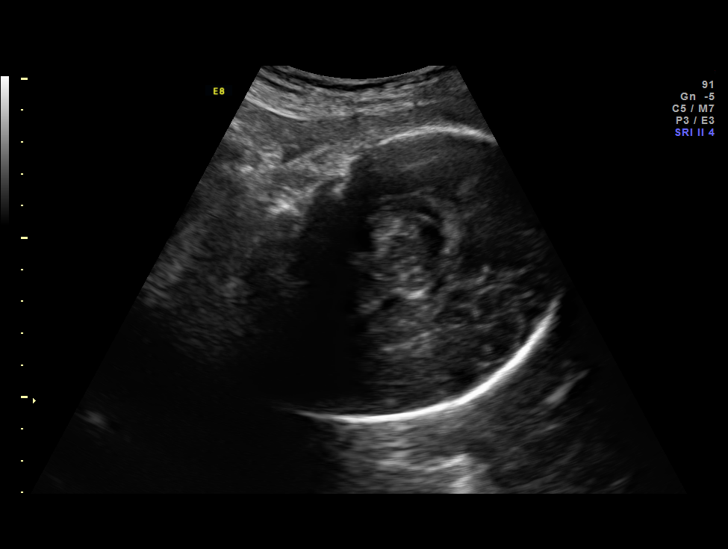
[im 6/47]
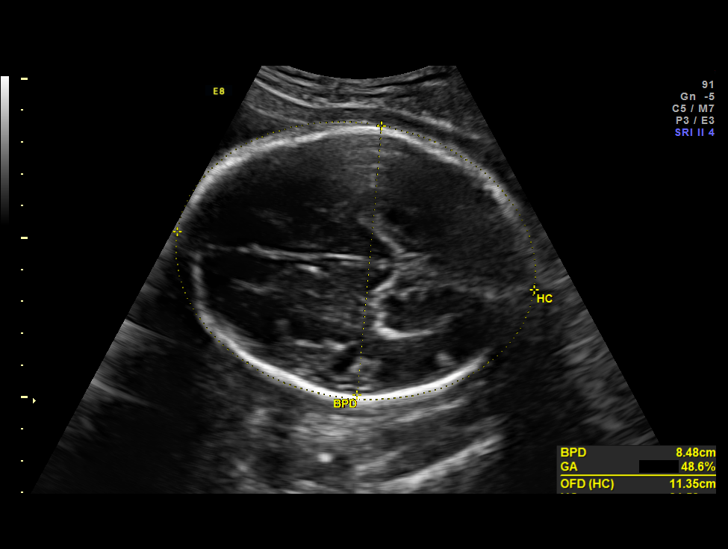
[im 9/47]
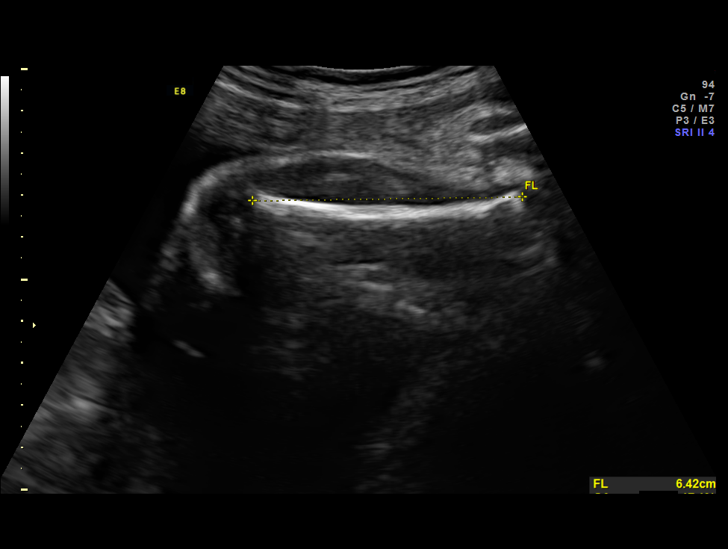
[im 14/47]
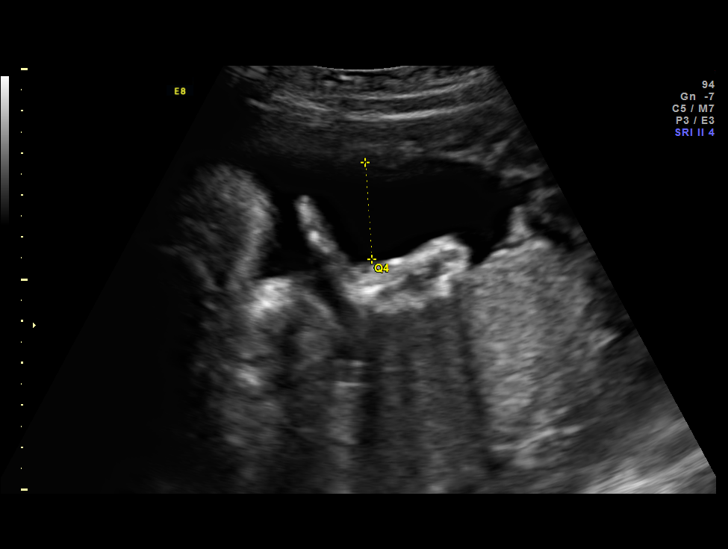
[im 18/47]
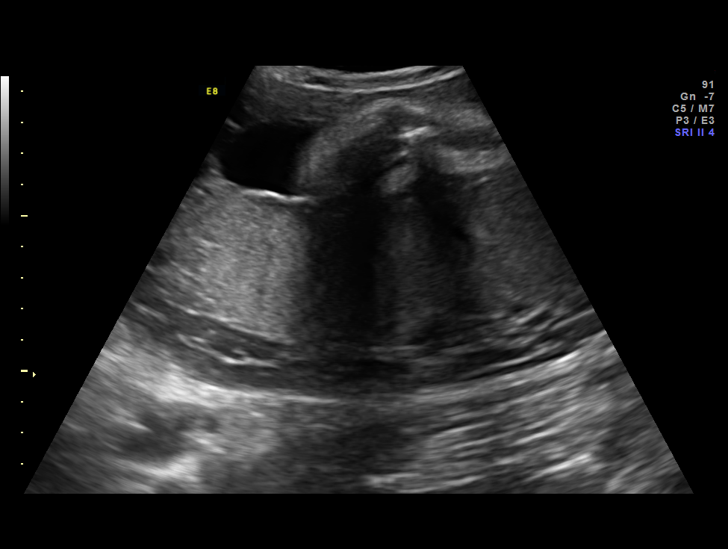
[im 21/47]
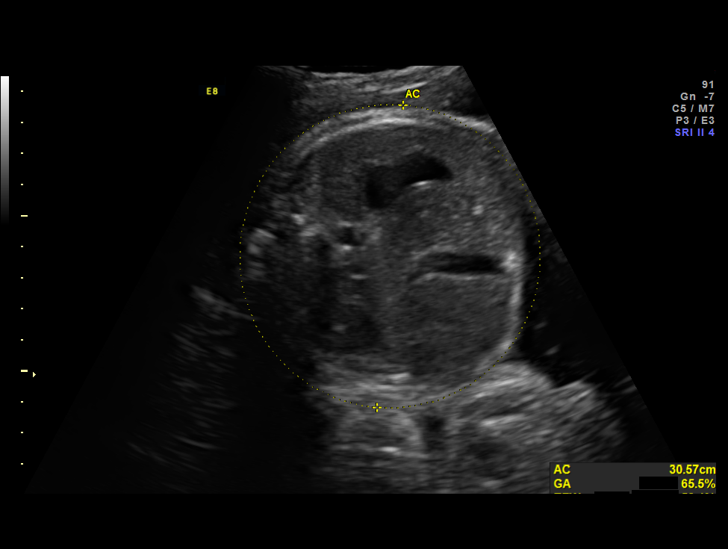
[im 26/47]
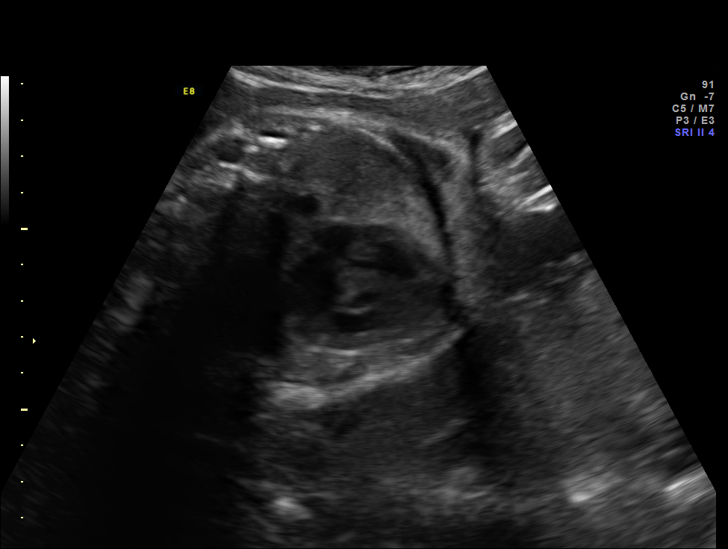
[im 29/47]
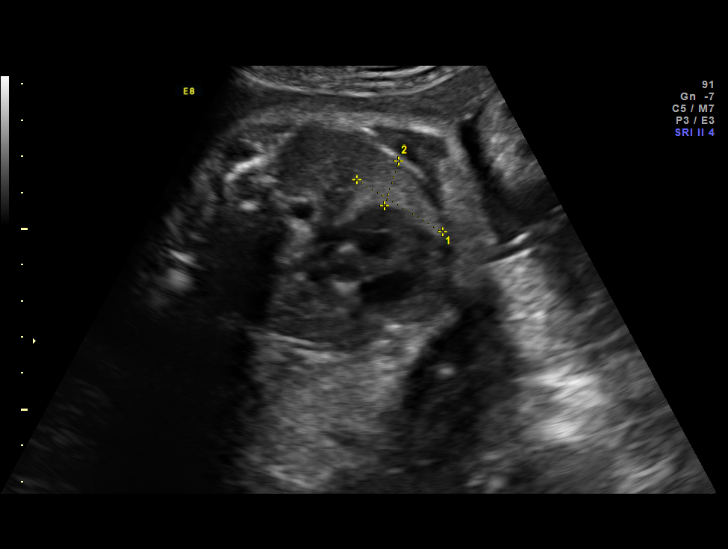
[im 33/47]
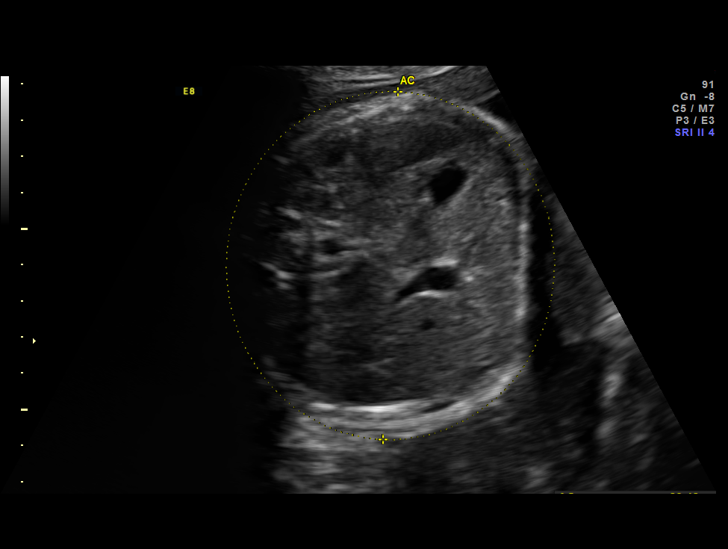
[im 38/47]
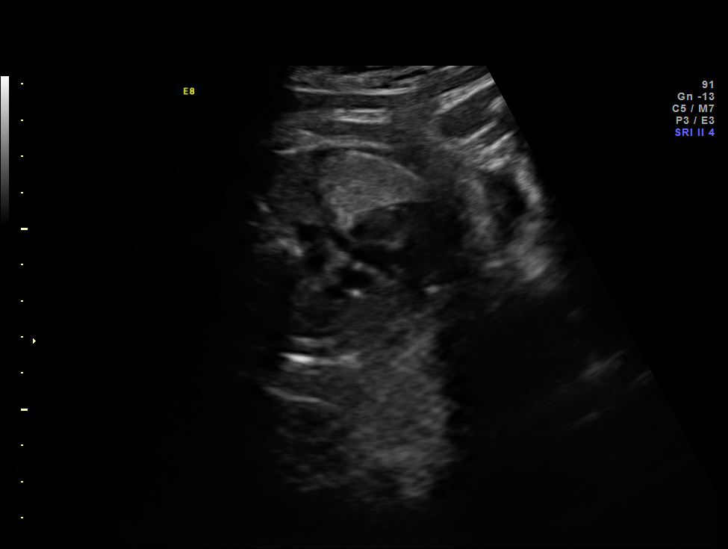
[im 41/47]
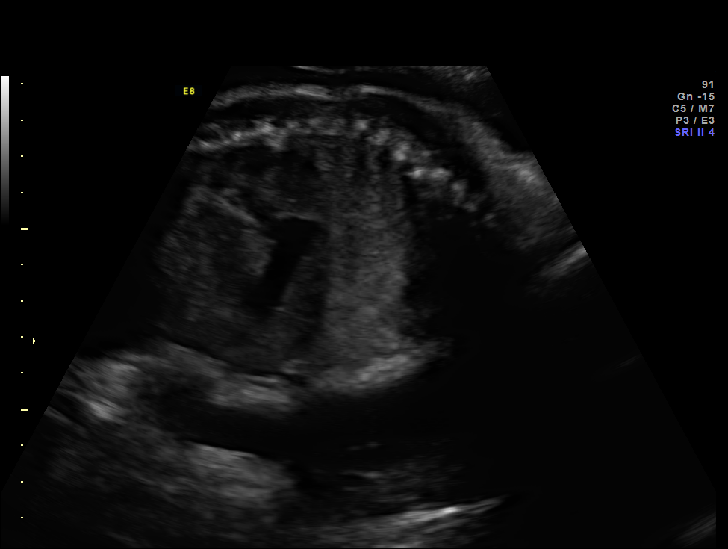
[im 45/47]
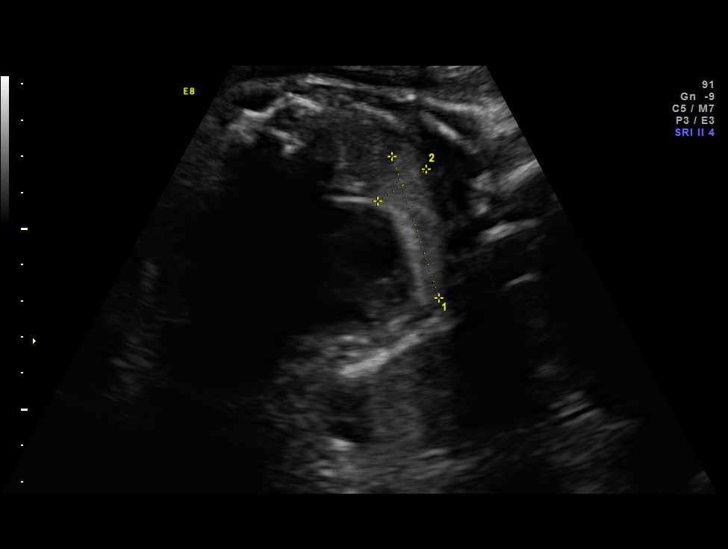

[12 of 28 positions shown; findings below may reference images not displayed]

OBSTETRICS REPORT
                      (Signed Final 10/08/2012 [DATE])

Service(s) Provided

 US OB FOLLOW UP                                       76816.1
Indications

 Fetal abnormality - other known or suspected
 (chest mass)
Fetal Evaluation

 Num Of Fetuses:    1
 Fetal Heart Rate:  132                          bpm
 Cardiac Activity:  Observed
 Presentation:      Cephalic
 Placenta:          Posterior, above cervical
                    os
 P. Cord            Previously Visualized
 Insertion:

 Amniotic Fluid
 AFI FV:      Subjectively within normal limits
 AFI Sum:     11.47   cm       30  %Tile     Larg Pckt:    4.27  cm
 RUQ:   2.62    cm   RLQ:    2.31   cm    LUQ:   4.27    cm   LLQ:    2.27   cm
Biometry

 BPD:     84.5  mm     G. Age:  34w 0d                CI:         75.4   70 - 86
 OFD:      112  mm                                    FL/HC:      20.5   19.4 -

 HC:     313.2  mm     G. Age:  35w 1d       38  %    HC/AC:      1.04   0.96 -

 AC:     300.7  mm     G. Age:  34w 0d       51  %    FL/BPD:     75.9   71 - 87
 FL:      64.1  mm     G. Age:  33w 1d       17  %    FL/AC:      21.3   20 - 24
 HUM:     56.2  mm     G. Age:  32w 5d       28  %

 Est. FW:    8872  gm      5 lb 1 oz     54  %
Gestational Age

 LMP:           34w 1d        Date:  02/12/12                 EDD:   11/18/12
 U/S Today:     34w 0d                                        EDD:   11/19/12
 Best:          34w 1d     Det. By:  LMP  (02/12/12)          EDD:   11/18/12
Anatomy
 Cranium:          Previously seen        Aortic Arch:      Previously seen
 Fetal Cavum:      Previously seen        Ductal Arch:      Previously seen
 Ventricles:       Previously seen        Diaphragm:        Previously seen
 Choroid Plexus:   Previously seen        Stomach:          Previously Seen
 Cerebellum:       Previously seen        Abdomen:          Previously seen
 Posterior Fossa:  Previously seen        Abdominal Wall:   Previously seen
 Nuchal Fold:      Previously seen        Cord Vessels:     Previously seen
 Face:             Orbits and profile     Kidneys:          Previously seen
                   previously seen
 Lips:             Previously seen        Bladder:          Previously seen
 Heart:            Previously seen        Spine:            Previously seen
 RVOT:             Previously seen        Lower             Previously seen
                                          Extremities:
 LVOT:             Previously seen        Upper             Previously seen
                                          Extremities:

 Other:  Heels and 5th digit previously seen.
Cervix Uterus Adnexa

 Cervix:       Not visualized (advanced GA >14wks)
Impression

 IUP at 34 [DATE]  weeks
 CPAM identifed on previous studies difficult to delineate.
 The left lower lung appears to be somewhat echogenic, but
 difficult to identify a distinct mass.  May be regression of a
 type 3 CPAM which is not uncommon.
 No mediastinal shift noted.
 Interval fetal growth is appropriate (54th %tile)
 Normal amniotic fluid volume

Recommendations

 Feel that the patient may safely deliver at [HOSPITAL].
 Recommend follow up with Peds surgery regardless of the
 findings after delivery.
 Follow-up ultrasounds as clinically indicated.

 questions or concerns.

## 2014-05-20 ENCOUNTER — Other Ambulatory Visit (HOSPITAL_COMMUNITY): Payer: Self-pay | Admitting: Orthopedic Surgery

## 2014-05-20 ENCOUNTER — Ambulatory Visit (HOSPITAL_COMMUNITY)
Admission: RE | Admit: 2014-05-20 | Discharge: 2014-05-20 | Disposition: A | Discharge status: Home / Self Care | Payer: Self-pay | Source: Ambulatory Visit | Attending: Cardiovascular Disease | Admitting: Cardiovascular Disease

## 2014-05-20 DIAGNOSIS — M7989 Other specified soft tissue disorders: Secondary | ICD-10-CM | POA: Insufficient documentation

## 2014-05-20 DIAGNOSIS — M79662 Pain in left lower leg: Secondary | ICD-10-CM

## 2014-05-20 DIAGNOSIS — M79605 Pain in left leg: Secondary | ICD-10-CM | POA: Insufficient documentation

## 2014-06-24 ENCOUNTER — Telehealth (HOSPITAL_COMMUNITY): Payer: Self-pay | Admitting: *Deleted

## 2014-08-23 ENCOUNTER — Other Ambulatory Visit: Payer: Self-pay | Admitting: Obstetrics and Gynecology

## 2014-08-30 ENCOUNTER — Encounter (HOSPITAL_COMMUNITY): Payer: Self-pay

## 2014-08-30 ENCOUNTER — Encounter (HOSPITAL_COMMUNITY)
Admission: RE | Admit: 2014-08-30 | Discharge: 2014-08-30 | Disposition: A | Discharge status: Home / Self Care | Payer: Self-pay | Source: Ambulatory Visit | Attending: Obstetrics and Gynecology | Admitting: Obstetrics and Gynecology

## 2014-08-30 DIAGNOSIS — Z01818 Encounter for other preprocedural examination: Secondary | ICD-10-CM | POA: Insufficient documentation

## 2014-08-30 LAB — CBC
HEMATOCRIT: 36.6 % (ref 36.0–46.0)
Hemoglobin: 12.4 g/dL (ref 12.0–15.0)
MCH: 31.4 pg (ref 26.0–34.0)
MCHC: 33.9 g/dL (ref 30.0–36.0)
MCV: 92.7 fL (ref 78.0–100.0)
PLATELETS: 125 10*3/uL — AB (ref 150–400)
RBC: 3.95 MIL/uL (ref 3.87–5.11)
RDW: 13.2 % (ref 11.5–15.5)
WBC: 10.5 10*3/uL (ref 4.0–10.5)

## 2014-08-30 NOTE — Patient Instructions (Addendum)
   Your procedure is scheduled on: AUG 31 AT 930AM  Enter through the Main Entrance of [facility] at: 8AM  Pick up the phone at the desk and dial [phone] and inform us of your arrival.  Please call this number if you have any problems the morning of surgery: [phone]  Remember: Do not eat or drink liquids after midnight Tuesday  Do not wear jewelry, make-up,  No metal in your hair or on your body. Do not wear lotions, powders, perfumes.  You may wear deodorant.  Do not bring valuables to the hospital.   Leave suitcase in the car. After Surgery it may be brought to your room. For patients being admitted to the hospital, checkout time is 11:00am the day of discharge.    Patients discharged on the day of surgery will not be allowed to drive home.

## 2014-08-31 LAB — RPR: RPR Ser Ql: NONREACTIVE

## 2014-08-31 NOTE — H&P (Signed)
Emma Butler is a 34 y.o. female presenting for primary csection for previa.  Maternal Medical History:  Contractions: Onset was less than 1 hour ago.   Frequency: irregular.   Perceived severity is mild.    Fetal activity: Perceived fetal activity is normal.   Last perceived fetal movement was within the past hour.    Prenatal complications: Placental abnormality and thrombocytopenia.   Prenatal Complications - Diabetes: none.    OB History    Gravida Para Term Preterm AB TAB SAB Ectopic Multiple Living   0 0 0 0 0 0 2     Past Medical History  Diagnosis Date  . Medical history non-contributory    No past surgical history on file. Family History: family history includes Cancer in her maternal grandfather and maternal grandmother; Depression in her paternal grandmother; Heart attack in her maternal grandmother; Hypertension in her father; Migraines in her brother and mother. Social History:  reports that she has never smoked. She has never used smokeless tobacco. She reports that she does not drink alcohol or use illicit drugs.   Prenatal Transfer Tool  Maternal Diabetes: No Genetic Screening: Normal Maternal Ultrasounds/Referrals: Normal Fetal Ultrasounds or other Referrals:  None Maternal Substance Abuse:  No Significant Maternal Medications:  None Significant Maternal Lab Results:  Lab values include: Group B Strep positive Other Comments:  None  Review of Systems  Constitutional: Negative.   All other systems reviewed and are negative.     Last menstrual period 12/17/2013, unknown if currently breastfeeding. Maternal Exam:  Uterine Assessment: Contraction strength is mild.  Contraction frequency is irregular.   Abdomen: Patient reports no abdominal tenderness. Fetal presentation: vertex  Introitus: Normal vulva. Normal vagina.  Ferning test: not done.  Nitrazine test: not done. Amniotic fluid character: not assessed.  Pelvis: adequate for delivery.    Cervix: Cervix evaluated by digital exam.     Physical Exam  Nursing note and vitals reviewed. Constitutional: She is oriented to person, place, and time. She appears well-developed and well-nourished.  HENT:  Head: Normocephalic and atraumatic.  Neck: Neck supple.  Cardiovascular: Normal rate and regular rhythm.   Respiratory: Effort normal and breath sounds normal.  GI: Soft. Bowel sounds are normal.  Genitourinary: Vagina normal and uterus normal.  Musculoskeletal: Normal range of motion.  Neurological: She is alert and oriented to person, place, and time. She has normal reflexes.  Skin: Skin is warm and dry.  Psychiatric: She has a normal mood and affect.    Prenatal labs: ABO, Rh: --/--/O POS (08/29 1040) Antibody: NEG (08/29 1040) Rubella:   RPR: Non Reactive (08/29 1040)  HBsAg:    HIV:    GBS:     Assessment/Plan: 37 weeks Placenta previa Primary csection Risks vs benefits discussed. Consent done.   Karn Derk J 08/31/2014, 10:06 PM

## 2014-09-01 ENCOUNTER — Inpatient Hospital Stay (HOSPITAL_COMMUNITY): Payer: Self-pay | Admitting: Anesthesiology

## 2014-09-01 ENCOUNTER — Inpatient Hospital Stay (HOSPITAL_COMMUNITY)
Admission: RE | Admit: 2014-09-01 | Discharge: 2014-09-03 | DRG: 765 | Disposition: A | Discharge status: Home / Self Care | Payer: Self-pay | Source: Ambulatory Visit | Attending: Obstetrics and Gynecology | Admitting: Obstetrics and Gynecology

## 2014-09-01 ENCOUNTER — Encounter (HOSPITAL_COMMUNITY)
Admission: RE | Disposition: A | Discharge status: Home / Self Care | Payer: Self-pay | Source: Ambulatory Visit | Attending: Obstetrics and Gynecology

## 2014-09-01 ENCOUNTER — Encounter (HOSPITAL_COMMUNITY): Payer: Self-pay | Admitting: Anesthesiology

## 2014-09-01 DIAGNOSIS — O99824 Streptococcus B carrier state complicating childbirth: Secondary | ICD-10-CM | POA: Diagnosis present

## 2014-09-01 DIAGNOSIS — O9912 Other diseases of the blood and blood-forming organs and certain disorders involving the immune mechanism complicating childbirth: Secondary | ICD-10-CM | POA: Diagnosis present

## 2014-09-01 DIAGNOSIS — Z2839 Other underimmunization status: Secondary | ICD-10-CM

## 2014-09-01 DIAGNOSIS — Z809 Family history of malignant neoplasm, unspecified: Secondary | ICD-10-CM

## 2014-09-01 DIAGNOSIS — Z3A37 37 weeks gestation of pregnancy: Secondary | ICD-10-CM | POA: Diagnosis present

## 2014-09-01 DIAGNOSIS — O4413 Placenta previa with hemorrhage, third trimester: Principal | ICD-10-CM | POA: Diagnosis present

## 2014-09-01 DIAGNOSIS — D696 Thrombocytopenia, unspecified: Secondary | ICD-10-CM | POA: Diagnosis present

## 2014-09-01 DIAGNOSIS — O9902 Anemia complicating childbirth: Secondary | ICD-10-CM | POA: Diagnosis present

## 2014-09-01 DIAGNOSIS — O9989 Other specified diseases and conditions complicating pregnancy, childbirth and the puerperium: Secondary | ICD-10-CM

## 2014-09-01 DIAGNOSIS — O9852 Other viral diseases complicating childbirth: Secondary | ICD-10-CM | POA: Diagnosis present

## 2014-09-01 DIAGNOSIS — Z283 Underimmunization status: Secondary | ICD-10-CM

## 2014-09-01 DIAGNOSIS — D62 Acute posthemorrhagic anemia: Secondary | ICD-10-CM | POA: Diagnosis not present

## 2014-09-01 DIAGNOSIS — O4400 Placenta previa specified as without hemorrhage, unspecified trimester: Secondary | ICD-10-CM | POA: Diagnosis present

## 2014-09-01 DIAGNOSIS — Z8249 Family history of ischemic heart disease and other diseases of the circulatory system: Secondary | ICD-10-CM

## 2014-09-01 DIAGNOSIS — O99119 Other diseases of the blood and blood-forming organs and certain disorders involving the immune mechanism complicating pregnancy, unspecified trimester: Secondary | ICD-10-CM

## 2014-09-01 LAB — PLATELET COUNT: PLATELETS: 117 10*3/uL — AB (ref 150–400)

## 2014-09-01 LAB — PREPARE RBC (CROSSMATCH)

## 2014-09-01 SURGERY — Surgical Case
Anesthesia: Spinal | Site: Abdomen

## 2014-09-01 MED ORDER — FENTANYL CITRATE (PF) 100 MCG/2ML IJ SOLN
INTRAMUSCULAR | Status: DC | PRN
  Administered 2014-09-01: 10 ug via INTRATHECAL

## 2014-09-01 MED ORDER — OXYCODONE-ACETAMINOPHEN 5-325 MG PO TABS: 2.0000 | ORAL_TABLET | ORAL | Status: DC | PRN

## 2014-09-01 MED ORDER — OXYTOCIN 10 UNIT/ML IJ SOLN
INTRAMUSCULAR | Status: AC
  Filled 2014-09-01: qty 4

## 2014-09-01 MED ORDER — BUPIVACAINE IN DEXTROSE 0.75-8.25 % IT SOLN
INTRATHECAL | Status: DC | PRN
  Administered 2014-09-01: 1.6 mL via INTRATHECAL

## 2014-09-01 MED ORDER — DIPHENHYDRAMINE HCL 25 MG PO CAPS
25.0000 mg | ORAL_CAPSULE | ORAL | Status: DC | PRN
  Administered 2014-09-02 (×2): 25 mg via ORAL
  Filled 2014-09-01: qty 1

## 2014-09-01 MED ORDER — BUPIVACAINE HCL (PF) 0.25 % IJ SOLN
INTRAMUSCULAR | Status: AC
  Filled 2014-09-01: qty 10

## 2014-09-01 MED ORDER — LACTATED RINGERS IV SOLN
INTRAVENOUS | Status: DC
  Administered 2014-09-01 – 2014-09-02 (×2): via INTRAVENOUS

## 2014-09-01 MED ORDER — ONDANSETRON HCL 4 MG/2ML IJ SOLN
INTRAMUSCULAR | Status: DC | PRN
  Administered 2014-09-01: 4 mg via INTRAVENOUS

## 2014-09-01 MED ORDER — PRENATAL MULTIVITAMIN CH
1.0000 | ORAL_TABLET | Freq: Every day | ORAL | Status: DC
Start: 2014-09-01 — End: 2014-09-03
  Administered 2014-09-02: 1 via ORAL
  Filled 2014-09-01: qty 1

## 2014-09-01 MED ORDER — FENTANYL CITRATE (PF) 100 MCG/2ML IJ SOLN: 25.0000 ug | INTRAMUSCULAR | Status: DC | PRN

## 2014-09-01 MED ORDER — ZOLPIDEM TARTRATE 5 MG PO TABS: 5.0000 mg | ORAL_TABLET | Freq: Every evening | ORAL | Status: DC | PRN

## 2014-09-01 MED ORDER — KETOROLAC TROMETHAMINE 30 MG/ML IJ SOLN: 30.0000 mg | Freq: Four times a day (QID) | INTRAMUSCULAR | Status: AC | PRN

## 2014-09-01 MED ORDER — BUPIVACAINE LIPOSOME 1.3 % IJ SUSP
20.0000 mL | Freq: Once | INTRAMUSCULAR | Status: AC
  Administered 2014-09-01: 20 mL
  Filled 2014-09-01: qty 20

## 2014-09-01 MED ORDER — MENTHOL 3 MG MT LOZG
1.0000 | LOZENGE | OROMUCOSAL | Status: DC | PRN
  Administered 2014-09-02: 3 mg via ORAL
  Filled 2014-09-01: qty 9

## 2014-09-01 MED ORDER — DIPHENHYDRAMINE HCL 50 MG/ML IJ SOLN: 12.5000 mg | INTRAMUSCULAR | Status: DC | PRN

## 2014-09-01 MED ORDER — MORPHINE SULFATE (PF) 0.5 MG/ML IJ SOLN
INTRAMUSCULAR | Status: DC | PRN
  Administered 2014-09-01: .2 mg via INTRATHECAL

## 2014-09-01 MED ORDER — TETANUS-DIPHTH-ACELL PERTUSSIS 5-2.5-18.5 LF-MCG/0.5 IM SUSP: 0.5000 mL | Freq: Once | INTRAMUSCULAR | Status: DC

## 2014-09-01 MED ORDER — ONDANSETRON HCL 4 MG/2ML IJ SOLN: 4.0000 mg | Freq: Three times a day (TID) | INTRAMUSCULAR | Status: DC | PRN

## 2014-09-01 MED ORDER — OXYCODONE-ACETAMINOPHEN 5-325 MG PO TABS
1.0000 | ORAL_TABLET | ORAL | Status: DC | PRN
  Administered 2014-09-02 – 2014-09-03 (×3): 1 via ORAL
  Filled 2014-09-01 (×3): qty 1

## 2014-09-01 MED ORDER — LACTATED RINGERS IV SOLN
INTRAVENOUS | Status: DC
  Administered 2014-09-01 (×3): via INTRAVENOUS

## 2014-09-01 MED ORDER — MORPHINE SULFATE 0.5 MG/ML IJ SOLN
INTRAMUSCULAR | Status: AC
  Filled 2014-09-01: qty 100

## 2014-09-01 MED ORDER — SIMETHICONE 80 MG PO CHEW
80.0000 mg | CHEWABLE_TABLET | ORAL | Status: DC
  Administered 2014-09-02 – 2014-09-03 (×2): 80 mg via ORAL
  Filled 2014-09-01 (×2): qty 1

## 2014-09-01 MED ORDER — EPHEDRINE SULFATE 50 MG/ML IJ SOLN
INTRAMUSCULAR | Status: DC | PRN
  Administered 2014-09-01: 10 mg via INTRAVENOUS

## 2014-09-01 MED ORDER — PHENYLEPHRINE 8 MG IN D5W 100 ML (0.08MG/ML) PREMIX OPTIME
INJECTION | INTRAVENOUS | Status: AC
  Filled 2014-09-01: qty 100

## 2014-09-01 MED ORDER — LANOLIN HYDROUS EX OINT: 1.0000 "application " | TOPICAL_OINTMENT | CUTANEOUS | Status: DC | PRN

## 2014-09-01 MED ORDER — OXYTOCIN 40 UNITS IN LACTATED RINGERS INFUSION - SIMPLE MED: 62.5000 mL/h | INTRAVENOUS | Status: AC

## 2014-09-01 MED ORDER — SCOPOLAMINE 1 MG/3DAYS TD PT72
1.0000 | MEDICATED_PATCH | Freq: Once | TRANSDERMAL | Status: DC
  Filled 2014-09-01: qty 1

## 2014-09-01 MED ORDER — FENTANYL CITRATE (PF) 100 MCG/2ML IJ SOLN
INTRAMUSCULAR | Status: AC
  Filled 2014-09-01: qty 4

## 2014-09-01 MED ORDER — METHYLERGONOVINE MALEATE 0.2 MG/ML IJ SOLN: 0.2000 mg | INTRAMUSCULAR | Status: DC | PRN

## 2014-09-01 MED ORDER — METHYLERGONOVINE MALEATE 0.2 MG/ML IJ SOLN
INTRAMUSCULAR | Status: AC
  Filled 2014-09-01: qty 1

## 2014-09-01 MED ORDER — METHYLERGONOVINE MALEATE 0.2 MG PO TABS: 0.2000 mg | ORAL_TABLET | ORAL | Status: DC | PRN

## 2014-09-01 MED ORDER — DIBUCAINE 1 % RE OINT: 1.0000 "application " | TOPICAL_OINTMENT | RECTAL | Status: DC | PRN

## 2014-09-01 MED ORDER — SCOPOLAMINE 1 MG/3DAYS TD PT72
1.0000 | MEDICATED_PATCH | Freq: Once | TRANSDERMAL | Status: DC
  Administered 2014-09-01: 1.5 mg via TRANSDERMAL

## 2014-09-01 MED ORDER — SODIUM CHLORIDE 0.9 % IJ SOLN
INTRAMUSCULAR | Status: AC
Start: 2014-09-01 — End: 2014-09-01
  Filled 2014-09-01: qty 20

## 2014-09-01 MED ORDER — PHENYLEPHRINE 8 MG IN D5W 100 ML (0.08MG/ML) PREMIX OPTIME
INJECTION | INTRAVENOUS | Status: DC | PRN
  Administered 2014-09-01: 60 ug/min via INTRAVENOUS

## 2014-09-01 MED ORDER — DIPHENHYDRAMINE HCL 25 MG PO CAPS
25.0000 mg | ORAL_CAPSULE | Freq: Four times a day (QID) | ORAL | Status: DC | PRN
  Filled 2014-09-01: qty 1

## 2014-09-01 MED ORDER — METHYLERGONOVINE MALEATE 0.2 MG/ML IJ SOLN
INTRAMUSCULAR | Status: DC | PRN
  Administered 2014-09-01: 0.2 mg via INTRAMUSCULAR

## 2014-09-01 MED ORDER — SIMETHICONE 80 MG PO CHEW: 80.0000 mg | CHEWABLE_TABLET | ORAL | Status: DC | PRN

## 2014-09-01 MED ORDER — NALOXONE HCL 0.4 MG/ML IJ SOLN: 0.4000 mg | INTRAMUSCULAR | Status: DC | PRN

## 2014-09-01 MED ORDER — BUPIVACAINE HCL (PF) 0.25 % IJ SOLN
INTRAMUSCULAR | Status: DC | PRN
  Administered 2014-09-01: 10 mL

## 2014-09-01 MED ORDER — MEPERIDINE HCL 25 MG/ML IJ SOLN: 6.2500 mg | INTRAMUSCULAR | Status: DC | PRN

## 2014-09-01 MED ORDER — CEFAZOLIN SODIUM-DEXTROSE 2-3 GM-% IV SOLR
2.0000 g | INTRAVENOUS | Status: AC
  Administered 2014-09-01: 2 g via INTRAVENOUS

## 2014-09-01 MED ORDER — SODIUM CHLORIDE 0.9 % IJ SOLN: 3.0000 mL | INTRAMUSCULAR | Status: DC | PRN

## 2014-09-01 MED ORDER — ACETAMINOPHEN 325 MG PO TABS: 650.0000 mg | ORAL_TABLET | ORAL | Status: DC | PRN

## 2014-09-01 MED ORDER — OXYTOCIN 10 UNIT/ML IJ SOLN
40.0000 [IU] | INTRAMUSCULAR | Status: DC | PRN
  Administered 2014-09-01: 40 [IU] via INTRAVENOUS

## 2014-09-01 MED ORDER — SODIUM CHLORIDE 0.9 % IV SOLN
INTRAVENOUS | Status: DC | PRN
  Administered 2014-09-01: 11:00:00 via INTRAVENOUS

## 2014-09-01 MED ORDER — CEFAZOLIN SODIUM-DEXTROSE 2-3 GM-% IV SOLR
INTRAVENOUS | Status: AC
  Filled 2014-09-01: qty 50

## 2014-09-01 MED ORDER — ONDANSETRON HCL 4 MG/2ML IJ SOLN
INTRAMUSCULAR | Status: AC
  Filled 2014-09-01: qty 2

## 2014-09-01 MED ORDER — WITCH HAZEL-GLYCERIN EX PADS: 1.0000 "application " | MEDICATED_PAD | CUTANEOUS | Status: DC | PRN

## 2014-09-01 MED ORDER — IBUPROFEN 600 MG PO TABS
600.0000 mg | ORAL_TABLET | Freq: Four times a day (QID) | ORAL | Status: DC
  Administered 2014-09-01 – 2014-09-03 (×7): 600 mg via ORAL
  Filled 2014-09-01 (×7): qty 1

## 2014-09-01 MED ORDER — PHENYLEPHRINE HCL 10 MG/ML IJ SOLN
INTRAMUSCULAR | Status: DC | PRN
  Administered 2014-09-01: 40 ug via INTRAVENOUS

## 2014-09-01 MED ORDER — ONDANSETRON HCL 4 MG/2ML IJ SOLN: 4.0000 mg | Freq: Once | INTRAMUSCULAR | Status: DC | PRN

## 2014-09-01 MED ORDER — SCOPOLAMINE 1 MG/3DAYS TD PT72
MEDICATED_PATCH | TRANSDERMAL | Status: AC
  Administered 2014-09-01: 1.5 mg via TRANSDERMAL
  Filled 2014-09-01: qty 1

## 2014-09-01 MED ORDER — SIMETHICONE 80 MG PO CHEW
80.0000 mg | CHEWABLE_TABLET | Freq: Three times a day (TID) | ORAL | Status: DC
  Administered 2014-09-02 – 2014-09-03 (×3): 80 mg via ORAL
  Filled 2014-09-01 (×3): qty 1

## 2014-09-01 MED ORDER — SENNOSIDES-DOCUSATE SODIUM 8.6-50 MG PO TABS
2.0000 | ORAL_TABLET | ORAL | Status: DC
  Administered 2014-09-02 – 2014-09-03 (×2): 2 via ORAL
  Filled 2014-09-01 (×2): qty 2

## 2014-09-01 MED ORDER — LACTATED RINGERS IV SOLN
INTRAVENOUS | Status: DC | PRN
  Administered 2014-09-01: 11:00:00 via INTRAVENOUS

## 2014-09-01 MED ORDER — DEXTROSE 5 % IV SOLN
1.0000 ug/kg/h | INTRAVENOUS | Status: DC | PRN
  Filled 2014-09-01: qty 2

## 2014-09-01 SURGICAL SUPPLY — 39 items
CLAMP CORD UMBIL (MISCELLANEOUS) IMPLANT
CLOTH BEACON ORANGE TIMEOUT ST (SAFETY) ×3 IMPLANT
CONTAINER PREFILL 10% NBF 15ML (MISCELLANEOUS) IMPLANT
DRAPE SHEET LG 3/4 BI-LAMINATE (DRAPES) IMPLANT
DRSG OPSITE POSTOP 4X10 (GAUZE/BANDAGES/DRESSINGS) ×3 IMPLANT
DURAPREP 26ML APPLICATOR (WOUND CARE) ×3 IMPLANT
ELECT REM PT RETURN 9FT ADLT (ELECTROSURGICAL) ×3
ELECTRODE REM PT RTRN 9FT ADLT (ELECTROSURGICAL) ×1 IMPLANT
EXTRACTOR VACUUM M CUP 4 TUBE (SUCTIONS) IMPLANT
EXTRACTOR VACUUM M CUP 4' TUBE (SUCTIONS)
GLOVE BIO SURGEON STRL SZ7.5 (GLOVE) ×3 IMPLANT
GOWN STRL REUS W/TWL LRG LVL3 (GOWN DISPOSABLE) ×6 IMPLANT
KIT ABG SYR 3ML LUER SLIP (SYRINGE) IMPLANT
NDL HYPO 25X5/8 SAFETYGLIDE (NEEDLE) IMPLANT
NDL SPNL 20GX3.5 QUINCKE YW (NEEDLE) IMPLANT
NEEDLE HYPO 22GX1.5 SAFETY (NEEDLE) ×5 IMPLANT
NEEDLE HYPO 25X5/8 SAFETYGLIDE (NEEDLE) IMPLANT
NEEDLE SPNL 20GX3.5 QUINCKE YW (NEEDLE) ×3 IMPLANT
NS IRRIG 1000ML POUR BTL (IV SOLUTION) ×3 IMPLANT
PACK C SECTION WH (CUSTOM PROCEDURE TRAY) ×3 IMPLANT
PAD ABD 7.5X8 STRL (GAUZE/BANDAGES/DRESSINGS) ×2 IMPLANT
PENCIL SMOKE EVAC W/HOLSTER (ELECTROSURGICAL) ×3 IMPLANT
SPONGE GAUZE 4X4 12PLY STER LF (GAUZE/BANDAGES/DRESSINGS) ×2 IMPLANT
SPONGE LAP 18X18 X RAY DECT (DISPOSABLE) ×2 IMPLANT
SUT CHROMIC 1 CTX 36 (SUTURE) ×2 IMPLANT
SUT MNCRL 0 VIOLET CTX 36 (SUTURE) ×2 IMPLANT
SUT MNCRL AB 3-0 PS2 27 (SUTURE) IMPLANT
SUT MON AB 2-0 CT1 27 (SUTURE) ×3 IMPLANT
SUT MON AB-0 CT1 36 (SUTURE) ×8 IMPLANT
SUT MONOCRYL 0 CTX 36 (SUTURE) ×6
SUT PLAIN 0 NONE (SUTURE) IMPLANT
SUT PLAIN 2 0 (SUTURE)
SUT PLAIN 2 0 XLH (SUTURE) IMPLANT
SUT PLAIN ABS 2-0 CT1 27XMFL (SUTURE) IMPLANT
SYR 20CC LL (SYRINGE) ×2 IMPLANT
SYR CONTROL 10ML LL (SYRINGE) ×3 IMPLANT
TAPE CLOTH SURG 4X10 WHT LF (GAUZE/BANDAGES/DRESSINGS) ×2 IMPLANT
TOWEL OR 17X24 6PK STRL BLUE (TOWEL DISPOSABLE) ×3 IMPLANT
TRAY FOLEY CATH SILVER 14FR (SET/KITS/TRAYS/PACK) ×3 IMPLANT

## 2014-09-01 NOTE — Op Note (Signed)
Cesarean Section Procedure Note  Indications: placenta previa  Pre-operative Diagnosis: 37 week 0 day pregnancy.  Post-operative Diagnosis: same  Surgeon: Lenoard Aden   Assistants: Denyse Amass, CNM  Anesthesia: Local anesthesia 0.25.% bupivacaine and Spinal anesthesia  ASA Class: 2  Procedure Details  The patient was seen in the Holding Room. The risks, benefits, complications, treatment options, and expected outcomes were discussed with the patient.  The patient concurred with the proposed plan, giving informed consent. The risks of anesthesia, infection, bleeding and possible injury to other organs discussed. Injury to bowel, bladder, or ureter with possible need for repair discussed. Possible need for transfusion with secondary risks of hepatitis or HIV acquisition discussed. Post operative complications to include but not limited to DVT, PE and Pneumonia noted. The site of surgery properly noted/marked. The patient was taken to Operating Room # 9, identified as Emma Butler and the procedure verified as C-Section Delivery. A Time Out was held and the above information confirmed.  After induction of anesthesia, the patient was draped and prepped in the usual sterile manner. A Pfannenstiel incision was made and carried down through the subcutaneous tissue to the fascia. Fascial incision was made and extended transversely using Mayo scissors. The fascia was separated from the underlying rectus tissue superiorly and inferiorly. The peritoneum was identified and entered. Peritoneal incision was extended longitudinally. The utero-vesical peritoneal reflection was incised transversely and the bladder flap was bluntly freed from the lower uterine segment. A low transverse uterine incision(Kerr hysterotomy) was made. Delivered from OT presentation was a  female with Apgar scores of 8 at one minute and 9 at five minutes. Bulb suctioning gently performed. Neonatal team in attendance.After the umbilical  cord was clamped and cut cord blood was obtained for evaluation. The placenta was removed intact and appeared to be a marginal previa. 0 chromic placed over bleeding area in post wall in LUS for hemostasis. The uterus was curetted with a dry lap pack. Good hemostasis was noted.The uterine outline, tubes and ovaries appeared normal. The uterine incision was closed with running locked sutures of 0 Monocryl x 2 layers. Hemostasis was observed. Lavage was carried out until clear.The parietal peritoneum was closed with a running 2-0 Monocryl suture. The fascia was then reapproximated with running sutures of 0 Monocryl. The skin was reapproximated with 3-0 monocryl after Plato closure with 2-0 plain.  Instrument, sponge, and needle counts were correct prior the abdominal closure and at the conclusion of the case.   Findings: FTLM, Posterior marginal previa  Estimated Blood Loss:  800         Drains: foley                 Specimens: placenta                 Complications:  None; patient tolerated the procedure well.         Disposition: PACU - hemodynamically stable.         Condition: stable  Attending Attestation: I performed the procedure.

## 2014-09-01 NOTE — Progress Notes (Signed)
Patient ID: Emma Butler, female   DOB: 1980/10/26, 34 y.o.   MRN: [id number] Patient seen and examined. Consent witnessed and signed. No changes noted. Update completed.

## 2014-09-01 NOTE — Anesthesia Postprocedure Evaluation (Signed)
  Anesthesia Post-op Note  Patient: Emma Butler  Procedure(s) Performed: Procedure(s) with comments: Primary CESAREAN SECTION (N/A) - EDD: 09/23/14 Allergy: Codeine, Adhesive  Patient Location: PACU  Anesthesia Type:Spinal  Level of Consciousness: awake, alert  and oriented  Airway and Oxygen Therapy: Patient Spontanous Breathing  Post-op Pain: none  Post-op Assessment: Post-op Vital signs reviewed, Patient's Cardiovascular Status Stable, Respiratory Function Stable, Patent Airway, No signs of Nausea or vomiting, Pain level controlled, No headache, No backache, Spinal receding and Patient able to bend at knees LLE Motor Response: Purposeful movement LLE Sensation: Tingling RLE Motor Response: Purposeful movement RLE Sensation: Tingling L Sensory Level: L5-Outer lower leg, top of foot, great toe R Sensory Level: L4-Anterior knee, lower leg  Post-op Vital Signs: Reviewed and stable  Last Vitals:  Filed Vitals:   09/01/14 1145  BP: 111/63  Pulse: 75  Temp:   Resp: 22    Complications: No apparent anesthesia complications

## 2014-09-01 NOTE — Anesthesia Preprocedure Evaluation (Signed)
Anesthesia Evaluation  Patient identified by MRN, date of birth, ID band Patient awake    Reviewed: Allergy & Precautions, NPO status , Patient's Chart, lab work & pertinent test results  History of Anesthesia Complications Negative for: history of anesthetic complications  Airway Mallampati: II  TM Distance: >3 FB Neck ROM: Full    Dental no notable dental hx. (+) Dental Advisory Given   Pulmonary neg pulmonary ROS,  breath sounds clear to auscultation  Pulmonary exam normal       Cardiovascular negative cardio ROS Normal cardiovascular examRhythm:Regular Rate:Normal     Neuro/Psych negative neurological ROS  negative psych ROS   GI/Hepatic negative GI ROS, Neg liver ROS,   Endo/Other  negative endocrine ROS  Renal/GU negative Renal ROS  negative genitourinary   Musculoskeletal negative musculoskeletal ROS (+)   Abdominal   Peds negative pediatric ROS (+)  Hematology negative hematology ROS (+)   Anesthesia Other Findings   Reproductive/Obstetrics (+) Pregnancy previa                             Anesthesia Physical Anesthesia Plan  ASA: II  Anesthesia Plan: Spinal   Post-op Pain Management:    Induction:   Airway Management Planned:   Additional Equipment:   Intra-op Plan:   Post-operative Plan:   Informed Consent: I have reviewed the patients History and Physical, chart, labs and discussed the procedure including the risks, benefits and alternatives for the proposed anesthesia with the patient or authorized representative who has indicated his/her understanding and acceptance.   Dental advisory given  Plan Discussed with: CRNA  Anesthesia Plan Comments:         Anesthesia Quick Evaluation

## 2014-09-01 NOTE — Anesthesia Postprocedure Evaluation (Signed)
  Anesthesia Post-op Note  Patient: Emma Butler  Procedure(s) Performed: Procedure(s) with comments: Primary CESAREAN SECTION (N/A) - EDD: 09/23/14 Allergy: Codeine, Adhesive  Patient Location: Mother/Baby  Anesthesia Type:Spinal  Level of Consciousness: awake, alert , oriented and patient cooperative  Airway and Oxygen Therapy: Patient Spontanous Breathing  Post-op Pain: none  Post-op Assessment: Post-op Vital signs reviewed, Patient's Cardiovascular Status Stable, Respiratory Function Stable, Patent Airway, No headache, No backache and Patient able to bend at knees  Post-op Vital Signs: Reviewed and stable  Last Vitals:  Filed Vitals:   09/01/14 1705  BP: 98/64  Pulse: 87  Temp:   Resp: 18    Complications: No apparent anesthesia complications

## 2014-09-01 NOTE — Anesthesia Procedure Notes (Signed)
Spinal Patient location during procedure: OR Staffing Anesthesiologist: Takeshi Teasdale Performed by: anesthesiologist  Preanesthetic Checklist Completed: patient identified, site marked, surgical consent, pre-op evaluation, timeout performed, IV checked, risks and benefits discussed and monitors and equipment checked Spinal Block Patient position: sitting Prep: ChloraPrep Patient monitoring: continuous pulse ox, blood pressure and heart rate Approach: midline Location: L3-4 Injection technique: single-shot Needle Needle type: Sprotte  Needle gauge: 24 G Needle length: 9 cm Additional Notes Functioning IV was confirmed and monitors were applied. Sterile prep and drape, including hand hygiene, mask and sterile gloves were used. The patient was positioned and the spine was prepped. The skin was anesthetized with lidocaine.  Free flow of clear CSF was obtained prior to injecting local anesthetic into the CSF.  The spinal needle aspirated freely following injection.  The needle was carefully withdrawn.  The patient tolerated the procedure well. Consent was obtained prior to procedure with all questions answered and concerns addressed. Risks including but not limited to bleeding, infection, nerve damage, paralysis, failed block, inadequate analgesia, allergic reaction, high spinal, itching and headache were discussed and the patient wished to proceed.   Karie Schwalbe, MDMD

## 2014-09-01 NOTE — Addendum Note (Signed)
Addendum  created 09/01/14 2001 by Yolonda Kida, CRNA   Modules edited: Notes Section   Notes Section:  File: [id number]

## 2014-09-01 NOTE — Transfer of Care (Signed)
Immediate Anesthesia Transfer of Care Note  Patient: Emma Butler  Procedure(s) Performed: Procedure(s) with comments: Primary CESAREAN SECTION (N/A) - EDD: 09/23/14 Allergy: Codeine, Adhesive  Patient Location: PACU  Anesthesia Type:Spinal  Level of Consciousness: awake, alert , oriented and patient cooperative  Airway & Oxygen Therapy: Patient Spontanous Breathing  Post-op Assessment: Report given to RN and Post -op Vital signs reviewed and stable  Post vital signs: Reviewed and stable  Last Vitals:  Filed Vitals:   09/01/14 0907  BP: 112/62  Pulse:   Temp:   Resp:     Complications: No apparent anesthesia complications

## 2014-09-02 ENCOUNTER — Encounter (HOSPITAL_COMMUNITY): Payer: Self-pay | Admitting: Obstetrics and Gynecology

## 2014-09-02 LAB — CBC
HCT: 31 % — ABNORMAL LOW (ref 36.0–46.0)
HEMOGLOBIN: 10.5 g/dL — AB (ref 12.0–15.0)
MCH: 31.5 pg (ref 26.0–34.0)
MCHC: 33.9 g/dL (ref 30.0–36.0)
MCV: 93.1 fL (ref 78.0–100.0)
PLATELETS: 122 10*3/uL — AB (ref 150–400)
RBC: 3.33 MIL/uL — ABNORMAL LOW (ref 3.87–5.11)
RDW: 13.3 % (ref 11.5–15.5)
WBC: 12.2 10*3/uL — ABNORMAL HIGH (ref 4.0–10.5)

## 2014-09-02 LAB — BIRTH TISSUE RECOVERY COLLECTION (PLACENTA DONATION)

## 2014-09-02 NOTE — Progress Notes (Signed)
UR chart review completed.  

## 2014-09-02 NOTE — Progress Notes (Signed)
Patient ID: Emma Butler, female   DOB: Mar 25, 1980, 34 y.o.   MRN: [id number] Subjective: S/P Primary Cesarean Delivery for Placenta Previa POD# 1 Information for the patient's newborn:  Makenzye, Troutman [[id number]]  female  / circ planning  Reports feeling well Feeding: breast Patient reports tolerating PO.  Breast symptoms: none Pain controlled with ibuprofen (OTC) and narcotic analgesics including Percocet Denies HA/SOB/C/P/N/V/dizziness. (+) Flatus. No BM. She reports vaginal bleeding as normal, without clots.  She is ambulating, urinating without difficult.     Objective:   VS:  Filed Vitals:   09/01/14 2125 09/02/14 0140 09/02/14 0540 09/02/14 0631  BP: $Re'97/63 87/49 90/49 'iFl$ 92/46  Pulse: 63 60 50 63  Temp: 98.1 F (36.7 C) 98.1 F (36.7 C) 98 F (36.7 C) 98.2 F (36.8 C)  TempSrc: Oral Oral Oral Oral  Resp: $Remo'18 18 16 18  'EUNdy$ SpO2: 95% 100% 98% 100%     Intake/Output Summary (Last 24 hours) at 09/02/14 0911 Last data filed at 09/02/14 0540  Gross per 24 hour  Intake 7178.67 ml  Output   6050 ml  Net 1128.67 ml        Recent Labs  08/30/14 1040 09/01/14 0835 09/02/14 0630  WBC 10.5  --  12.2*  HGB 12.4  --  10.5*  HCT 36.6  --  31.0*  PLT 125* 117* 122*     Blood type: --/--/O POS (08/29 1040)  Rubella: Indeterminate - needs MMR    Physical Exam:  General: alert, cooperative and no distress CV: Regular rate and rhythm, S1S2 present or without murmur or extra heart sounds Resp: clear Abdomen: soft, nontender, normal bowel sounds Incision: Completely covered with pressure dressing / labelled sutures Uterine Fundus: firm, umbilicus - even, nontender Lochia: minimal Ext: extremities normal, atraumatic, no cyanosis or edema, Homans sign is negative, no sign of DVT and no edema, redness or tenderness in the calves or thighs   Assessment/Plan: 34 y.o.   POD# 1.  s/p Cesarean Delivery.  Indications: placenta previa                Principal Problem:   Postpartum  care following cesarean delivery (8/31) Active Problems:   Placenta previa  Doing well, stable.               Regular diet as tolerated Ambulate Routine post-op care Remove pressure dressing Administer MMR  Graceann Congress, MSN, CNM 09/02/2014, 9:11 AM

## 2014-09-03 DIAGNOSIS — Z283 Underimmunization status: Secondary | ICD-10-CM

## 2014-09-03 DIAGNOSIS — O99892 Other specified diseases and conditions complicating childbirth: Secondary | ICD-10-CM

## 2014-09-03 DIAGNOSIS — O99119 Other diseases of the blood and blood-forming organs and certain disorders involving the immune mechanism complicating pregnancy, unspecified trimester: Secondary | ICD-10-CM

## 2014-09-03 DIAGNOSIS — O9989 Other specified diseases and conditions complicating pregnancy, childbirth and the puerperium: Secondary | ICD-10-CM

## 2014-09-03 DIAGNOSIS — D62 Acute posthemorrhagic anemia: Secondary | ICD-10-CM | POA: Diagnosis not present

## 2014-09-03 DIAGNOSIS — D696 Thrombocytopenia, unspecified: Secondary | ICD-10-CM | POA: Diagnosis present

## 2014-09-03 LAB — TYPE AND SCREEN
ABO/RH(D): O POS
ANTIBODY SCREEN: NEGATIVE
UNIT DIVISION: 0
Unit division: 0
Unit division: 0
Unit division: 0

## 2014-09-03 MED ORDER — MEASLES, MUMPS & RUBELLA VAC ~~LOC~~ INJ
0.5000 mL | INJECTION | Freq: Once | SUBCUTANEOUS | Status: AC
  Administered 2014-09-03: 0.5 mL via SUBCUTANEOUS
  Filled 2014-09-03: qty 0.5

## 2014-09-03 MED ORDER — OXYCODONE-ACETAMINOPHEN 5-325 MG PO TABS: 1.0000 | ORAL_TABLET | ORAL | Status: AC | PRN

## 2014-09-03 MED ORDER — IBUPROFEN 600 MG PO TABS: 600.0000 mg | ORAL_TABLET | Freq: Four times a day (QID) | ORAL | Status: AC

## 2014-09-03 NOTE — Progress Notes (Addendum)
POD # 2  Subjective: Pt reports feeling well, desires early discharge/ Pain controlled with Motrin and Percocet Tolerating po/Voiding without problems/ No n/v/ Flatus present Activity: ad lib Bleeding is light Newborn info:  Information for the patient's newborn:  Efrat, Zuidema [[id number]]  female  / Circumcision: done/ Feeding: breast   Objective: VS: VS:  Filed Vitals:   09/02/14 0631 09/02/14 0958 09/02/14 1830 09/03/14 0600  BP: 92/46 92/53 107/59 102/55  Pulse: 63 62 66 56  Temp: 98.2 F (36.8 C) 98.4 F (36.9 C) 97.6 F (36.4 C) 98.5 F (36.9 C)  TempSrc: Oral Oral Oral Oral  Resp: $Remo'18 20 18 18  'EQFtN$ SpO2: 100% 97%      I&O: Intake/Output      09/01 0701 - 09/02 0700 09/02 0701 - 09/03 0700   P.O.     I.V.     Total Intake       Urine 1600    Blood     Total Output 1600     Net -1600            LABS:  Recent Labs  09/01/14 0835 09/02/14 0630  WBC  --  12.2*  HGB  --  10.5*  PLT 117* 122*   Blood type: --/--/O POS (08/29 1040) Rubella:   Non-immune      Physical Exam:  General: alert, cooperative and no distress CV: Regular rate and rhythm Resp: CTA bilaterally Abdomen: soft, nontender, normal bowel sounds Incision: Covered with Tegaderm and honeycomb dressing; no significant drainage, edema, bruising, or erythema; well approximated with suture Uterine Fundus: firm, below umbilicus, nontender Lochia: minimal Ext: extremities normal, atraumatic, no cyanosis or edema and Homans sign is negative, no sign of DVT    Assessment: POD # 2/ G3P2103/ S/P C/Section d/t previa ABL anemia Gestational thrombocytopenia, delivered-stable Doing well and stable for discharge home  Plan: MMR before d/c Discharge home RX's: Ibuprofen $RemoveBeforeD'600mg'[id number]$  po Q 6 hrs prn pain #30 Refill x 1 Percocet 5/325 1 - 2 tabs po every 4 hrs prn pain #30 Refill x 0 Follow up in 6 wks for postpartum check at [facility] Ob/[facility]yn booklet given    Signed: Julianne Handler, Delane Ginger, MSN,  CNM 09/03/2014, 8:47 AM

## 2014-09-03 NOTE — Discharge Summary (Signed)
DISCHARGE SUMMARY:  Patient ID: Emma Butler MRN: [id number] DOB/AGE: 1980/02/13 34 y.o.  Admit date: 09/01/2014 Admission Diagnoses: 36.[redacted] weeks gestation, placenta previa   Discharge date: 09/03/2014 Discharge Diagnoses: S/P C/S on 09/01/14; gestational thrombocytopenia, delivered, ABL anemia; rubella non-immune        Prenatal history: G3P2103   EDC: 09/23/2014, by Last Menstrual Period  Prenatal care at [facility] since [redacted] wks gestation. Primary provider: Dr. Ronita Hipps Prenatal course complicated by placenta previa, gestational thrombocytopenia; GBS positive  Prenatal labs: ABO, Rh: --/--/O POS (08/29 1040)  Antibody: NEG (08/29 1040) Rubella:   non-immune RPR: Non Reactive (08/29 1040)  HBsAg:   neg HIV:   neg GBS:   POS GTT: 133  Medical / Surgical History :  Past medical history:  Past Medical History  Diagnosis Date  . Medical history non-contributory     Past surgical history:  Past Surgical History  Procedure Laterality Date  . Cesarean section      x2  . Cesarean section N/A 09/01/2014    Procedure: Primary CESAREAN SECTION;  Surgeon: Brien Few, MD;  Location: [facility];  Service: Obstetrics;  Laterality: N/A;  EDD: 09/23/14 Allergy: Codeine, Adhesive     Medications on Admission: Prescriptions prior to admission  Medication Sig Dispense Refill Last Dose  . Loratadine (CLARITIN PO) Take 1 tablet by mouth daily as needed (allergies).   Past Month at Unknown time  . Prenatal Vit-Fe Fumarate-FA (PRENATAL MULTIVITAMIN) TABS tablet Take 1 tablet by mouth at bedtime.    08/31/2014 at Unknown time    Allergies: Codeine   Intrapartum Course:  Admitted for schedule primary CS under regional anesthesia   Postpartum Course: Uncomplicated;  MMR administered.  Physical Exam:   VSS: Blood pressure 102/55, pulse 56, temperature 98.5 F (36.9 C), temperature source Oral, resp. rate 18, last menstrual period 12/17/2013, SpO2 97 %, unknown if  currently breastfeeding.  LABS:  Recent Labs  09/01/14 0835 09/02/14 0630  WBC  --  12.2*  HGB  --  10.5*  PLT 117* 122*    General: alert and oriented x3 Heart: RRR Lungs: CTA bilaterally GI: soft, non-tender, non-distended, BS x4 Lochia: small Uterus: firm below umbilicus Incision: well approximated; honeycomb dressing-no significant erythema, drainage, or edema Extremities: No edema, Homans neg   Newborn Data Live born female  Birth Weight: 6 lb 13 oz (3090 g) APGAR: 8, 9  See operative report for further details  Home with mother.  Discharge Instructions:  Wound Care: keep clean and dry / remove honeycomb POD 6 Postpartum Instructions: [facility] discharge booklet - instructions reviewed Medications:    Medication List    TAKE these medications        CLARITIN PO  Take 1 tablet by mouth daily as needed (allergies).     ibuprofen 600 MG tablet  Commonly known as:  ADVIL,MOTRIN  Take 1 tablet (600 mg total) by mouth every 6 (six) hours.     oxyCODONE-acetaminophen 5-325 MG per tablet  Commonly known as:  PERCOCET/ROXICET  Take 1-2 tablets by mouth every 4 (four) hours as needed (for pain scale greater than 7).     prenatal multivitamin Tabs tablet  Take 1 tablet by mouth at bedtime.            Follow-up Information    Follow up with Lovenia Kim, MD. Schedule an appointment as soon as possible for a visit in 6 weeks.   Specialty:  Obstetrics and Gynecology   Contact information:  [place] [address] [phone]         Signed: Julianne Handler, Delane Ginger MSN, CNM 09/03/2014, 9:32 AM

## 2014-09-03 NOTE — Lactation Note (Signed)
This note was copied from the chart of Emma Butler. Lactation Consultation Note  Patient Name: Emma Butler [id number] Date: 09/03/2014 Reason for consult: Follow-up assessment  Baby has been primarily feeding at breast. Mom reports hearing swallows during feedings. Mom has not been pumping. Mom has my # to call to assess next feeding.  Lurline Hare [facility] 09/03/2014, 8:49 AM
